# Patient Record
Sex: Female | Born: 1970 | Race: White | Hispanic: No | Marital: Married | Smoking: Never smoker
Health system: Southern US, Community
[De-identification: ages and names within clinical notes are randomized; demographics above are authoritative.]

## PROBLEM LIST (undated history)

## (undated) DIAGNOSIS — E119 Type 2 diabetes mellitus without complications: Secondary | ICD-10-CM

## (undated) DIAGNOSIS — T7840XA Allergy, unspecified, initial encounter: Secondary | ICD-10-CM

## (undated) DIAGNOSIS — R9389 Abnormal findings on diagnostic imaging of other specified body structures: Secondary | ICD-10-CM

## (undated) DIAGNOSIS — F32A Depression, unspecified: Secondary | ICD-10-CM

## (undated) DIAGNOSIS — G479 Sleep disorder, unspecified: Secondary | ICD-10-CM

## (undated) DIAGNOSIS — R091 Pleurisy: Secondary | ICD-10-CM

## (undated) DIAGNOSIS — E785 Hyperlipidemia, unspecified: Secondary | ICD-10-CM

## (undated) DIAGNOSIS — M940 Chondrocostal junction syndrome [Tietze]: Secondary | ICD-10-CM

## (undated) DIAGNOSIS — F329 Major depressive disorder, single episode, unspecified: Secondary | ICD-10-CM

## (undated) DIAGNOSIS — I10 Essential (primary) hypertension: Secondary | ICD-10-CM

## (undated) DIAGNOSIS — K219 Gastro-esophageal reflux disease without esophagitis: Secondary | ICD-10-CM

## (undated) DIAGNOSIS — F419 Anxiety disorder, unspecified: Secondary | ICD-10-CM

## (undated) HISTORY — DX: Major depressive disorder, single episode, unspecified: F32.9

## (undated) HISTORY — DX: Allergy, unspecified, initial encounter: T78.40XA

## (undated) HISTORY — DX: Chondrocostal junction syndrome (tietze): M94.0

## (undated) HISTORY — DX: Pleurisy: R09.1

## (undated) HISTORY — DX: Sleep disorder, unspecified: G47.9

## (undated) HISTORY — PX: TUBAL LIGATION: SHX77

## (undated) HISTORY — DX: Depression, unspecified: F32.A

## (undated) HISTORY — DX: Abnormal findings on diagnostic imaging of other specified body structures: R93.89

## (undated) HISTORY — DX: Gastro-esophageal reflux disease without esophagitis: K21.9

## (undated) HISTORY — DX: Essential (primary) hypertension: I10

## (undated) HISTORY — DX: Hyperlipidemia, unspecified: E78.5

## (undated) HISTORY — PX: DILATION AND CURETTAGE OF UTERUS: SHX78

## (undated) HISTORY — DX: Anxiety disorder, unspecified: F41.9

---

## 2002-12-29 ENCOUNTER — Other Ambulatory Visit: Admission: RE | Admit: 2002-12-29 | Discharge: 2002-12-29 | Payer: Self-pay | Admitting: Obstetrics & Gynecology

## 2003-08-13 ENCOUNTER — Inpatient Hospital Stay (HOSPITAL_COMMUNITY): Admission: AD | Admit: 2003-08-13 | Discharge: 2003-08-17 | Payer: Self-pay | Admitting: Obstetrics & Gynecology

## 2003-08-18 ENCOUNTER — Encounter: Admission: RE | Admit: 2003-08-18 | Discharge: 2003-09-17 | Payer: Self-pay | Admitting: Obstetrics & Gynecology

## 2003-09-18 ENCOUNTER — Encounter: Admission: RE | Admit: 2003-09-18 | Discharge: 2003-10-18 | Payer: Self-pay | Admitting: Obstetrics & Gynecology

## 2004-02-01 ENCOUNTER — Other Ambulatory Visit: Admission: RE | Admit: 2004-02-01 | Discharge: 2004-02-01 | Payer: Self-pay | Admitting: Obstetrics & Gynecology

## 2005-02-25 ENCOUNTER — Other Ambulatory Visit: Admission: RE | Admit: 2005-02-25 | Discharge: 2005-02-25 | Payer: Self-pay | Admitting: Obstetrics & Gynecology

## 2005-11-12 DIAGNOSIS — F32A Depression, unspecified: Secondary | ICD-10-CM | POA: Insufficient documentation

## 2005-11-12 DIAGNOSIS — E109 Type 1 diabetes mellitus without complications: Secondary | ICD-10-CM | POA: Insufficient documentation

## 2005-11-12 DIAGNOSIS — E782 Mixed hyperlipidemia: Secondary | ICD-10-CM | POA: Insufficient documentation

## 2005-11-12 DIAGNOSIS — F329 Major depressive disorder, single episode, unspecified: Secondary | ICD-10-CM | POA: Insufficient documentation

## 2005-11-12 DIAGNOSIS — E785 Hyperlipidemia, unspecified: Secondary | ICD-10-CM

## 2005-11-12 HISTORY — DX: Hyperlipidemia, unspecified: E78.5

## 2007-09-14 DIAGNOSIS — M1990 Unspecified osteoarthritis, unspecified site: Secondary | ICD-10-CM | POA: Insufficient documentation

## 2007-09-14 DIAGNOSIS — F419 Anxiety disorder, unspecified: Secondary | ICD-10-CM

## 2007-09-14 HISTORY — DX: Anxiety disorder, unspecified: F41.9

## 2007-10-12 ENCOUNTER — Ambulatory Visit: Payer: Self-pay | Admitting: Family Medicine

## 2007-10-12 DIAGNOSIS — R091 Pleurisy: Secondary | ICD-10-CM

## 2007-10-12 HISTORY — DX: Pleurisy: R09.1

## 2008-12-03 ENCOUNTER — Ambulatory Visit: Payer: Self-pay | Admitting: Family Medicine

## 2008-12-13 ENCOUNTER — Ambulatory Visit: Payer: Self-pay | Admitting: Family Medicine

## 2008-12-19 DIAGNOSIS — M940 Chondrocostal junction syndrome [Tietze]: Secondary | ICD-10-CM | POA: Insufficient documentation

## 2008-12-19 HISTORY — DX: Chondrocostal junction syndrome (tietze): M94.0

## 2008-12-28 HISTORY — PX: CHOLECYSTECTOMY: SHX55

## 2009-12-03 ENCOUNTER — Ambulatory Visit: Payer: Self-pay

## 2009-12-19 ENCOUNTER — Ambulatory Visit: Payer: Self-pay | Admitting: Surgery

## 2009-12-25 ENCOUNTER — Ambulatory Visit: Payer: Self-pay | Admitting: Surgery

## 2011-01-23 ENCOUNTER — Ambulatory Visit (HOSPITAL_COMMUNITY)
Admission: RE | Admit: 2011-01-23 | Discharge: 2011-01-23 | Payer: Self-pay | Source: Home / Self Care | Attending: Obstetrics & Gynecology | Admitting: Obstetrics & Gynecology

## 2011-01-23 LAB — CBC
MCV: 86 fL (ref 78.0–100.0)
Platelets: 285 10*3/uL (ref 150–400)
RDW: 12.3 % (ref 11.5–15.5)
WBC: 7.2 10*3/uL (ref 4.0–10.5)

## 2011-01-24 NOTE — Op Note (Signed)
  NAMEMYKENZIE, Andrea Hayden              ACCOUNT NO.:  [id number]  MEDICAL RECORD NO.:  [id number]          PATIENT TYPE:  AMB  LOCATION:  SDC                           FACILITY:  [facility]  PHYSICIAN:  Ilda Mori, Andrea.D.   DATE OF BIRTH:  06-18-1971  DATE OF PROCEDURE:  01/23/2011 DATE OF DISCHARGE:  01/23/2011                              OPERATIVE REPORT   PREOPERATIVE DIAGNOSIS:  Missed abortion.  POSTOPERATIVE DIAGNOSIS:  Missed abortion.  PROCEDURE:  Dilatation and evacuation.  SURGEON:  Ilda Mori, MD  ANESTHESIA:  Paracervical block with IV sedation.  BLOOD LOSS:  20 mL.  FINDINGS:  Products of conception consistent with an 8-week pregnancy. Pathology and chromosomal analysis were obtained from the specimen.  COMPLICATIONS:  Were none.  INDICATIONS:  This is a 40 year old, gravida 4, para 1-0-3-1 female, who was diagnosed with a missed ab earlier today.  An utlrasound done 3 weeks prior showed a living fetus at [redacted] weeks gestation.  Today a repeat ultrasound should a fetal pole only 8  week size with no cardiac activity.  The findings were discussed with the patient and she elected to proceed with evacuation of the failed pregnancy.  PROCEDURE:  The patient was taken to the operating room and placed in the dorsal lithotomy supine position and IV sedation was administered. The vagina was prepped and draped in sterile fashion.  The cervix was infiltrated with 10 mL of 2% lidocaine solution.  The internal os was easily dilated with Shawnie Pons dilators to 25-French and #8 suction curette was introduced and the products of conception were evacuated.  A portion of the specimen was sent for chromosomal analysis.  The procedure was then terminated and the patient left the operating room in good condition.     Ilda Mori, Andrea.D.     RK/MEDQ  D:  01/23/2011  T:  2011/01/26  Job:  [id number]  Electronically Signed by Ilda Mori Andrea.D. on January 26, 2011 12:41:55 PM

## 2011-01-28 DEATH — deceased

## 2012-01-04 ENCOUNTER — Ambulatory Visit: Payer: Self-pay | Admitting: Family Medicine

## 2012-06-15 ENCOUNTER — Ambulatory Visit: Payer: Self-pay | Admitting: Family Medicine

## 2012-06-15 LAB — CBC WITH DIFFERENTIAL/PLATELET
Basophil #: 0 10*3/uL (ref 0.0–0.1)
Eosinophil %: 0.5 %
HGB: 12.6 g/dL (ref 12.0–16.0)
Lymphocyte %: 11.4 %
MCHC: 33.8 g/dL (ref 32.0–36.0)
MCV: 87 fL (ref 80–100)
Monocyte %: 7.8 %
Neutrophil #: 7.1 10*3/uL — ABNORMAL HIGH (ref 1.4–6.5)
Platelet: 247 10*3/uL (ref 150–440)
RBC: 4.31 10*6/uL (ref 3.80–5.20)

## 2012-06-15 LAB — COMPREHENSIVE METABOLIC PANEL
Albumin: 3.7 g/dL (ref 3.4–5.0)
Alkaline Phosphatase: 116 U/L (ref 50–136)
BUN: 10 mg/dL (ref 7–18)
Bilirubin,Total: 0.4 mg/dL (ref 0.2–1.0)
Calcium, Total: 8.8 mg/dL (ref 8.5–10.1)
Chloride: 103 mmol/L (ref 98–107)
Co2: 29 mmol/L (ref 21–32)
Creatinine: 0.74 mg/dL (ref 0.60–1.30)
EGFR (African American): >60
Glucose: 98 mg/dL (ref 65–99)
Sodium: 139 mmol/L (ref 136–145)

## 2012-06-22 ENCOUNTER — Ambulatory Visit: Payer: Self-pay | Admitting: Family Medicine

## 2013-04-24 ENCOUNTER — Other Ambulatory Visit: Payer: Self-pay | Admitting: Obstetrics & Gynecology

## 2013-04-24 ENCOUNTER — Other Ambulatory Visit: Payer: Self-pay

## 2013-04-25 ENCOUNTER — Other Ambulatory Visit: Payer: Self-pay | Admitting: Obstetrics & Gynecology

## 2013-07-05 ENCOUNTER — Other Ambulatory Visit: Payer: Self-pay | Admitting: Obstetrics and Gynecology

## 2013-07-17 ENCOUNTER — Other Ambulatory Visit: Payer: Self-pay

## 2013-07-19 ENCOUNTER — Other Ambulatory Visit: Payer: Self-pay

## 2013-07-25 ENCOUNTER — Other Ambulatory Visit (HOSPITAL_COMMUNITY): Payer: Self-pay | Admitting: Obstetrics and Gynecology

## 2013-07-25 DIAGNOSIS — O283 Abnormal ultrasonic finding on antenatal screening of mother: Secondary | ICD-10-CM

## 2013-07-25 DIAGNOSIS — O09529 Supervision of elderly multigravida, unspecified trimester: Secondary | ICD-10-CM

## 2013-07-28 ENCOUNTER — Encounter (HOSPITAL_COMMUNITY): Payer: Self-pay

## 2013-07-28 ENCOUNTER — Ambulatory Visit (HOSPITAL_COMMUNITY)
Admission: RE | Admit: 2013-07-28 | Discharge: 2013-07-28 | Disposition: A | Discharge status: Home / Self Care | Payer: BC Managed Care – PPO | Source: Ambulatory Visit | Attending: Obstetrics and Gynecology | Admitting: Obstetrics and Gynecology

## 2013-07-28 DIAGNOSIS — O24919 Unspecified diabetes mellitus in pregnancy, unspecified trimester: Secondary | ICD-10-CM | POA: Insufficient documentation

## 2013-07-28 DIAGNOSIS — Z363 Encounter for antenatal screening for malformations: Secondary | ICD-10-CM | POA: Insufficient documentation

## 2013-07-28 DIAGNOSIS — Z1389 Encounter for screening for other disorder: Secondary | ICD-10-CM | POA: Insufficient documentation

## 2013-07-28 DIAGNOSIS — O358XX0 Maternal care for other (suspected) fetal abnormality and damage, not applicable or unspecified: Secondary | ICD-10-CM | POA: Insufficient documentation

## 2013-07-28 DIAGNOSIS — O09529 Supervision of elderly multigravida, unspecified trimester: Secondary | ICD-10-CM | POA: Insufficient documentation

## 2013-07-28 DIAGNOSIS — O283 Abnormal ultrasonic finding on antenatal screening of mother: Secondary | ICD-10-CM

## 2013-07-28 NOTE — Progress Notes (Signed)
Norberta MAKENZIE WEISNER  was seen today for an ultrasound appointment.  See full report in [facility].  Comments: Ms. Florence is a 42 yo G5P1031 currently at 45 6/7 weeks with a history of type 1 diabetes on an insulin pump.  She recently had a normal fetal echo.  The patient was seen today due to concerns of  echogenic lungs.  Impression: Single IUP at 28 6/7 weeks Normal anatomic survey. The fetal lungs appear to be of normal texture - no discrete masses are noted.  There is no evidence of mediastinal shift. The estimated fetal weight today is at the 87th %tile. Normal amniotic fluid volume.  Recommendations: Recommend serial growth scans every 4 weeks for interval growth- please contact our office if you would prefer that these studies be performed here. Antepartum fetal testing (NSTs with weekly AFIs) beginning at 32 weeks.  Alpha Gula, MD

## 2013-08-05 DIAGNOSIS — E7800 Pure hypercholesterolemia, unspecified: Secondary | ICD-10-CM | POA: Insufficient documentation

## 2013-08-29 ENCOUNTER — Encounter (HOSPITAL_COMMUNITY): Payer: Self-pay | Admitting: *Deleted

## 2013-08-29 ENCOUNTER — Inpatient Hospital Stay (HOSPITAL_COMMUNITY)
Admission: AD | Admit: 2013-08-29 | Discharge: 2013-08-29 | Disposition: A | Discharge status: Home / Self Care | Payer: BC Managed Care – PPO | Source: Ambulatory Visit | Attending: Obstetrics and Gynecology | Admitting: Obstetrics and Gynecology

## 2013-08-29 DIAGNOSIS — O139 Gestational [pregnancy-induced] hypertension without significant proteinuria, unspecified trimester: Secondary | ICD-10-CM | POA: Insufficient documentation

## 2013-08-29 DIAGNOSIS — O4700 False labor before 37 completed weeks of gestation, unspecified trimester: Secondary | ICD-10-CM | POA: Insufficient documentation

## 2013-08-29 DIAGNOSIS — O133 Gestational [pregnancy-induced] hypertension without significant proteinuria, third trimester: Secondary | ICD-10-CM

## 2013-08-29 HISTORY — DX: Type 2 diabetes mellitus without complications: E11.9

## 2013-08-29 LAB — URINALYSIS, ROUTINE W REFLEX MICROSCOPIC
Bilirubin Urine: NEGATIVE
Nitrite: NEGATIVE
Specific Gravity, Urine: 1.01 (ref 1.005–1.030)
Urobilinogen, UA: 0.2 mg/dL (ref 0.0–1.0)

## 2013-08-29 LAB — COMPREHENSIVE METABOLIC PANEL
Albumin: 2.5 g/dL — ABNORMAL LOW (ref 3.5–5.2)
BUN: 8 mg/dL (ref 6–23)
Creatinine, Ser: 0.64 mg/dL (ref 0.50–1.10)
Total Protein: 6.5 g/dL (ref 6.0–8.3)

## 2013-08-29 LAB — URINE MICROSCOPIC-ADD ON

## 2013-08-29 LAB — CBC
HCT: 36 % (ref 36.0–46.0)
MCHC: 34.4 g/dL (ref 30.0–36.0)
MCV: 86.7 fL (ref 78.0–100.0)
RDW: 14 % (ref 11.5–15.5)

## 2013-08-29 MED ORDER — NIFEDIPINE 10 MG PO CAPS
10.0000 mg | ORAL_CAPSULE | ORAL | Status: AC
  Administered 2013-08-29 (×2): 10 mg via ORAL
  Filled 2013-08-29 (×2): qty 1

## 2013-08-29 MED ORDER — NIFEDIPINE 10 MG PO CAPS
10.0000 mg | ORAL_CAPSULE | Freq: Once | ORAL | Status: AC
  Administered 2013-08-29: 10 mg via ORAL
  Filled 2013-08-29: qty 1

## 2013-08-29 NOTE — MAU Provider Note (Signed)
History     CSN: [id number]  Arrival date and time: 08/29/13 1656   None     Chief Complaint  Patient presents with  . high blood pressure3    HPI  Pt is a G5P1031 at [redacted]w[redacted]d sent from office with report of elevated blood pressure.  Pt denies headache, epigastric pain or vision changes.  Report intermittent contractions. Cervix reported as closed at office.  +fetal movement.  No vaginal bleeding.    Past Medical History  Diagnosis Date  . Diabetes mellitus without complication     Type 1: external insulin pump    Past Surgical History  Procedure Laterality Date  . Cholecystectomy  2010  . Cesarean section    . Dilation and curettage of uterus      Family History  Problem Relation Age of Onset  . Hypertension Mother   . Hypertension Father   . Hypertension Paternal Grandmother   . Hypertension Paternal Grandfather     History  Substance Use Topics  . Smoking status: Never Smoker   . Smokeless tobacco: Not on file  . Alcohol Use: No    Allergies: No Known Allergies  Prescriptions prior to admission  Medication Sig Dispense Refill  . acetaminophen (TYLENOL) 325 MG tablet Take 650 mg by mouth daily as needed for pain.      . Insulin Human (INSULIN PUMP) 100 unit/ml SOLN Inject 1 each into the skin 3 times daily with meals, bedtime and 2 AM. Novolog insulin      . Prenatal Vit-Fe Fumarate-FA (PRENATAL MULTIVITAMIN) TABS tablet Take 1 tablet by mouth daily at 12 noon.        Review of Systems  Eyes: Negative for blurred vision, double vision and photophobia.  Gastrointestinal: Positive for abdominal pain (cramping).  Neurological: Negative for headaches.  All other systems reviewed and are negative.   Physical Exam   Blood pressure 159/83, pulse 88, temperature 98.3 F (36.8 C), temperature source Oral, resp. rate 18, height 5\' 4"  (1.626 m), weight 99.338 kg (219 lb), last menstrual period 01/07/2013, SpO2 100.00%. Filed Vitals:   08/29/13 1820 08/29/13 1825  08/29/13 1830 08/29/13 1831  BP:    149/77  Pulse: 83 85 74 75  Temp:      TempSrc:      Resp:      Height:      Weight:      SpO2: 99% 98% 99%     Physical Exam  Constitutional: She is oriented to person, place, and time. She appears well-developed and well-nourished. No distress.  HENT:  Head: Normocephalic.  Neck: Normal range of motion. Neck supple.  Cardiovascular: Normal rate, regular rhythm and normal heart sounds.   Respiratory: Effort normal and breath sounds normal.  GI: Soft. There is no tenderness.  Genitourinary: No bleeding around the vagina. Vaginal discharge (mucusy) found.  Musculoskeletal: Normal range of motion. She exhibits edema (3+ pedal ).  Neurological: She is alert and oriented to person, place, and time. She displays normal reflexes.  Skin: Skin is warm and dry.   FHR 140's, +accels, reactive Toco - 3-8  Dilation: Closed Effacement (%): 0 Cervical Position: Posterior Exam by:: Roney Marion, CNM  MAU Course  Procedures  Results for orders placed during the hospital encounter of 08/29/13 (from the past 24 hour(s))  URINALYSIS, ROUTINE W REFLEX MICROSCOPIC     Status: Abnormal   Collection Time    08/29/13  5:15 PM      Result Value Range  Color, Urine YELLOW  YELLOW   APPearance CLEAR  CLEAR   Specific Gravity, Urine 1.010  1.005 - 1.030   pH 6.0  5.0 - 8.0   Glucose, UA NEGATIVE  NEGATIVE mg/dL   Hgb urine dipstick TRACE (*) NEGATIVE   Bilirubin Urine NEGATIVE  NEGATIVE   Ketones, ur NEGATIVE  NEGATIVE mg/dL   Protein, ur NEGATIVE  NEGATIVE mg/dL   Urobilinogen, UA 0.2  0.0 - 1.0 mg/dL   Nitrite NEGATIVE  NEGATIVE   Leukocytes, UA NEGATIVE  NEGATIVE  URINE MICROSCOPIC-ADD ON     Status: Abnormal   Collection Time    08/29/13  5:15 PM      Result Value Range   Squamous Epithelial / LPF FEW (*) RARE   RBC / HPF 0-2  <3 RBC/hpf  CBC     Status: None   Collection Time    08/29/13  6:10 PM      Result Value Range   WBC 10.3  4.0 - 10.5  K/uL   RBC 4.15  3.87 - 5.11 MIL/uL   Hemoglobin 12.4  12.0 - 15.0 g/dL   HCT 40.9  81.1 - 91.4 %   MCV 86.7  78.0 - 100.0 fL   MCH 29.9  26.0 - 34.0 pg   MCHC 34.4  30.0 - 36.0 g/dL   RDW 78.2  95.6 - 21.3 %   Platelets 220  150 - 400 K/uL  COMPREHENSIVE METABOLIC PANEL     Status: Abnormal   Collection Time    08/29/13  6:10 PM      Result Value Range   Sodium 135  135 - 145 mEq/L   Potassium 4.0  3.5 - 5.1 mEq/L   Chloride 102  96 - 112 mEq/L   CO2 22  19 - 32 mEq/L   Glucose, Bld 65 (*) 70 - 99 mg/dL   BUN 8  6 - 23 mg/dL   Creatinine, Ser 0.86  0.50 - 1.10 mg/dL   Calcium 9.5  8.4 - 57.8 mg/dL   Total Protein 6.5  6.0 - 8.3 g/dL   Albumin 2.5 (*) 3.5 - 5.2 g/dL   AST 15  0 - 37 U/L   ALT 13  0 - 35 U/L   Alkaline Phosphatase 134 (*) 39 - 117 U/L   Total Bilirubin 0.2 (*) 0.3 - 1.2 mg/dL   GFR calc non Af Amer >90  >90 mL/min   GFR calc Af Amer >90  >90 mL/min   0702 Dr. Dareen Piano called with results > no answer 0710 Dr. Dareen Piano called > left message 0730 Informed by staff Dr. Henderson Cloud is covering > give PO Procardia and may discharge to home with follow-up on Friday 0740 Pt informed regarding order for procardia and plan to reevaluate.   [id number]  Report given to H. Mathews Robinsons who assumes care of patient. 2130: Contractions have stopped with procardia. Will DC home.  [facility] 08/29/2013, 8:00 PM   Assessment and Plan   1. Gestational hypertension w/o significant proteinuria in 3rd trimester    Danger signs/pre-eclampsia precautions given Fetal kick counts FU with the office on Friday.   Tawnya Crook

## 2013-08-29 NOTE — MAU Note (Signed)
Patient presents today having been sent over from the office for elevated blood pressure. Was checked in office and was closed. Denies LOF, contractions, nor bleeding. Denies headache nor blurry vision.

## 2013-09-11 ENCOUNTER — Encounter (HOSPITAL_COMMUNITY): Payer: Self-pay | Admitting: *Deleted

## 2013-09-11 ENCOUNTER — Encounter (HOSPITAL_COMMUNITY)
Admission: AD | Disposition: A | Discharge status: Home / Self Care | Payer: Self-pay | Source: Ambulatory Visit | Attending: Obstetrics and Gynecology

## 2013-09-11 ENCOUNTER — Inpatient Hospital Stay (HOSPITAL_COMMUNITY)
Admission: AD | Admit: 2013-09-11 | Discharge: 2013-09-15 | DRG: 650 | Disposition: A | Discharge status: Home / Self Care | Payer: BC Managed Care – PPO | Source: Ambulatory Visit | Attending: Obstetrics and Gynecology | Admitting: Obstetrics and Gynecology

## 2013-09-11 ENCOUNTER — Inpatient Hospital Stay (HOSPITAL_COMMUNITY): Payer: BC Managed Care – PPO | Admitting: Anesthesiology

## 2013-09-11 ENCOUNTER — Encounter (HOSPITAL_COMMUNITY): Payer: Self-pay | Admitting: Anesthesiology

## 2013-09-11 DIAGNOSIS — Z794 Long term (current) use of insulin: Secondary | ICD-10-CM

## 2013-09-11 DIAGNOSIS — O09529 Supervision of elderly multigravida, unspecified trimester: Secondary | ICD-10-CM | POA: Diagnosis present

## 2013-09-11 DIAGNOSIS — E119 Type 2 diabetes mellitus without complications: Secondary | ICD-10-CM

## 2013-09-11 DIAGNOSIS — O1414 Severe pre-eclampsia complicating childbirth: Principal | ICD-10-CM | POA: Diagnosis present

## 2013-09-11 DIAGNOSIS — O2432 Unspecified pre-existing diabetes mellitus in childbirth: Secondary | ICD-10-CM | POA: Diagnosis present

## 2013-09-11 DIAGNOSIS — Z302 Encounter for sterilization: Secondary | ICD-10-CM

## 2013-09-11 DIAGNOSIS — E109 Type 1 diabetes mellitus without complications: Secondary | ICD-10-CM | POA: Diagnosis present

## 2013-09-11 LAB — COMPREHENSIVE METABOLIC PANEL
AST: 15 U/L (ref 0–37)
BUN: 8 mg/dL (ref 6–23)
CO2: 20 mEq/L (ref 19–32)
CO2: 20 mEq/L (ref 19–32)
Calcium: 8.4 mg/dL (ref 8.4–10.5)
Chloride: 101 mEq/L (ref 96–112)
Creatinine, Ser: 0.6 mg/dL (ref 0.50–1.10)
Creatinine, Ser: 0.58 mg/dL (ref 0.50–1.10)
GFR calc Af Amer: >90 mL/min (ref >90)
GFR calc non Af Amer: >90 mL/min (ref >90)
GFR calc non Af Amer: >90 mL/min (ref >90)
Glucose, Bld: 106 mg/dL — ABNORMAL HIGH (ref 70–99)
Total Bilirubin: 0.2 mg/dL — ABNORMAL LOW (ref 0.3–1.2)
Total Protein: 5.9 g/dL — ABNORMAL LOW (ref 6.0–8.3)

## 2013-09-11 LAB — CBC
HCT: 36.1 % (ref 36.0–46.0)
Hemoglobin: 12.7 g/dL (ref 12.0–15.0)
MCH: 30.3 pg (ref 26.0–34.0)
MCHC: 35.2 g/dL (ref 30.0–36.0)
MCV: 86.4 fL (ref 78.0–100.0)
MCV: 86.2 fL (ref 78.0–100.0)
Platelets: 197 10*3/uL (ref 150–400)
RBC: 4.18 MIL/uL (ref 3.87–5.11)
RBC: 4.19 MIL/uL (ref 3.87–5.11)
WBC: 9.6 10*3/uL (ref 4.0–10.5)

## 2013-09-11 LAB — GLUCOSE, CAPILLARY
Glucose-Capillary: 68 mg/dL — ABNORMAL LOW (ref 70–99)
Glucose-Capillary: 106 mg/dL — ABNORMAL HIGH (ref 70–99)
Glucose-Capillary: 136 mg/dL — ABNORMAL HIGH (ref 70–99)
Glucose-Capillary: 236 mg/dL — ABNORMAL HIGH (ref 70–99)

## 2013-09-11 LAB — LACTATE DEHYDROGENASE: LDH: 185 U/L (ref 94–250)

## 2013-09-11 LAB — URINALYSIS, ROUTINE W REFLEX MICROSCOPIC
Bilirubin Urine: NEGATIVE
Ketones, ur: NEGATIVE mg/dL
Nitrite: NEGATIVE
pH: 6 (ref 5.0–8.0)

## 2013-09-11 LAB — URINE MICROSCOPIC-ADD ON

## 2013-09-11 LAB — URIC ACID: Uric Acid, Serum: 4.8 mg/dL (ref 2.4–7.0)

## 2013-09-11 SURGERY — Surgical Case
Anesthesia: Spinal | Site: Abdomen | Wound class: Clean Contaminated

## 2013-09-11 MED ORDER — MAGNESIUM SULFATE 40 G IN LACTATED RINGERS - SIMPLE
2.0000 g/h | INTRAVENOUS | Status: DC
  Administered 2013-09-12: 2 g/h via INTRAVENOUS
  Filled 2013-09-11 (×2): qty 500

## 2013-09-11 MED ORDER — INSULIN REGULAR BOLUS VIA INFUSION
0.0000 [IU] | Freq: Three times a day (TID) | INTRAVENOUS | Status: DC
  Administered 2013-09-12 (×2): 0 [IU] via INTRAVENOUS
  Filled 2013-09-11: qty 10

## 2013-09-11 MED ORDER — KETOROLAC TROMETHAMINE 60 MG/2ML IM SOLN
60.0000 mg | Freq: Once | INTRAMUSCULAR | Status: AC | PRN
  Administered 2013-09-11: 60 mg via INTRAMUSCULAR

## 2013-09-11 MED ORDER — CITRIC ACID-SODIUM CITRATE 334-500 MG/5ML PO SOLN
ORAL | Status: AC
  Administered 2013-09-11: 30 mL
  Filled 2013-09-11: qty 15

## 2013-09-11 MED ORDER — LABETALOL HCL 5 MG/ML IV SOLN
10.0000 mg | INTRAVENOUS | Status: DC | PRN
  Administered 2013-09-11 – 2013-09-13 (×3): 10 mg via INTRAVENOUS
  Filled 2013-09-11 (×4): qty 4

## 2013-09-11 MED ORDER — BUPIVACAINE IN DEXTROSE 0.75-8.25 % IT SOLN
INTRATHECAL | Status: DC | PRN
  Administered 2013-09-11: 1.4 mL via INTRATHECAL

## 2013-09-11 MED ORDER — SODIUM CHLORIDE 0.9 % IV SOLN
INTRAVENOUS | Status: DC
  Administered 2013-09-11: 2 [IU]/h via INTRAVENOUS
  Administered 2013-09-11: 6.1 [IU]/h via INTRAVENOUS
  Administered 2013-09-11: 3.5 [IU]/h via INTRAVENOUS
  Filled 2013-09-11: qty 1

## 2013-09-11 MED ORDER — OXYTOCIN 10 UNIT/ML IJ SOLN
40.0000 [IU] | INTRAVENOUS | Status: DC | PRN
  Administered 2013-09-11: 40 [IU] via INTRAVENOUS

## 2013-09-11 MED ORDER — KETOROLAC TROMETHAMINE 60 MG/2ML IM SOLN
INTRAMUSCULAR | Status: AC
  Filled 2013-09-11: qty 2

## 2013-09-11 MED ORDER — SCOPOLAMINE 1 MG/3DAYS TD PT72
1.0000 | MEDICATED_PATCH | Freq: Once | TRANSDERMAL | Status: AC
  Administered 2013-09-11: 1.5 mg via TRANSDERMAL

## 2013-09-11 MED ORDER — FENTANYL CITRATE 0.05 MG/ML IJ SOLN
25.0000 ug | INTRAMUSCULAR | Status: DC | PRN
  Administered 2013-09-12 (×2): 50 ug via INTRAVENOUS

## 2013-09-11 MED ORDER — DEXTROSE 5 % IN LACTATED RINGERS IV BOLUS: 125.0000 mL | Freq: Once | INTRAVENOUS | Status: DC

## 2013-09-11 MED ORDER — NIFEDIPINE 10 MG PO CAPS
10.0000 mg | ORAL_CAPSULE | Freq: Once | ORAL | Status: AC
  Administered 2013-09-11: 10 mg via ORAL
  Filled 2013-09-11: qty 1

## 2013-09-11 MED ORDER — PRENATAL MULTIVITAMIN CH: 1.0000 | ORAL_TABLET | Freq: Every day | ORAL | Status: DC

## 2013-09-11 MED ORDER — DEXTROSE IN LACTATED RINGERS 5 % IV SOLN
INTRAVENOUS | Status: DC
  Administered 2013-09-11: 1000 mL via INTRAVENOUS
  Administered 2013-09-11: 15:00:00 via INTRAVENOUS

## 2013-09-11 MED ORDER — MORPHINE SULFATE 0.5 MG/ML IJ SOLN
INTRAMUSCULAR | Status: AC
  Filled 2013-09-11: qty 10

## 2013-09-11 MED ORDER — MEPERIDINE HCL 25 MG/ML IJ SOLN
INTRAMUSCULAR | Status: AC
  Filled 2013-09-11: qty 1

## 2013-09-11 MED ORDER — DOCUSATE SODIUM 100 MG PO CAPS: 100.0000 mg | ORAL_CAPSULE | Freq: Every day | ORAL | Status: DC

## 2013-09-11 MED ORDER — OXYTOCIN 10 UNIT/ML IJ SOLN
INTRAMUSCULAR | Status: AC
  Filled 2013-09-11: qty 4

## 2013-09-11 MED ORDER — ACETAMINOPHEN 325 MG PO TABS: 650.0000 mg | ORAL_TABLET | ORAL | Status: DC | PRN

## 2013-09-11 MED ORDER — CALCIUM CARBONATE ANTACID 500 MG PO CHEW: 2.0000 | CHEWABLE_TABLET | ORAL | Status: DC | PRN

## 2013-09-11 MED ORDER — DEXTROSE-NACL 5-0.45 % IV SOLN
INTRAVENOUS | Status: DC
  Administered 2013-09-12: 02:00:00 via INTRAVENOUS

## 2013-09-11 MED ORDER — MORPHINE SULFATE (PF) 0.5 MG/ML IJ SOLN
INTRAMUSCULAR | Status: DC | PRN
  Administered 2013-09-11: .15 mg via INTRATHECAL

## 2013-09-11 MED ORDER — PHENYLEPHRINE 40 MCG/ML (10ML) SYRINGE FOR IV PUSH (FOR BLOOD PRESSURE SUPPORT)
PREFILLED_SYRINGE | INTRAVENOUS | Status: AC
  Filled 2013-09-11: qty 15

## 2013-09-11 MED ORDER — MAGNESIUM SULFATE BOLUS VIA INFUSION
6.0000 g | Freq: Once | INTRAVENOUS | Status: AC
  Administered 2013-09-11: 6 g via INTRAVENOUS
  Filled 2013-09-11: qty 500

## 2013-09-11 MED ORDER — ONDANSETRON HCL 4 MG/2ML IJ SOLN
INTRAMUSCULAR | Status: AC
  Filled 2013-09-11: qty 2

## 2013-09-11 MED ORDER — CEFAZOLIN SODIUM-DEXTROSE 2-3 GM-% IV SOLR
2.0000 g | INTRAVENOUS | Status: AC
  Administered 2013-09-11: 2 g via INTRAVENOUS
  Filled 2013-09-11: qty 50

## 2013-09-11 MED ORDER — SODIUM CHLORIDE 0.45 % IV SOLN: INTRAVENOUS | Status: DC

## 2013-09-11 MED ORDER — PHENYLEPHRINE 40 MCG/ML (10ML) SYRINGE FOR IV PUSH (FOR BLOOD PRESSURE SUPPORT)
PREFILLED_SYRINGE | INTRAVENOUS | Status: AC
  Filled 2013-09-11: qty 10

## 2013-09-11 MED ORDER — DEXTROSE 50 % IV SOLN: 25.0000 mL | INTRAVENOUS | Status: DC | PRN

## 2013-09-11 MED ORDER — INSULIN ASPART 100 UNIT/ML ~~LOC~~ SOLN: 0.0000 [IU] | Freq: Once | SUBCUTANEOUS | Status: DC

## 2013-09-11 MED ORDER — LACTATED RINGERS IV SOLN
INTRAVENOUS | Status: DC | PRN
  Administered 2013-09-11: 19:00:00 via INTRAVENOUS

## 2013-09-11 MED ORDER — MEPERIDINE HCL 25 MG/ML IJ SOLN: 6.2500 mg | INTRAMUSCULAR | Status: DC | PRN

## 2013-09-11 MED ORDER — ONDANSETRON HCL 4 MG/2ML IJ SOLN
INTRAMUSCULAR | Status: DC | PRN
  Administered 2013-09-11: 4 mg via INTRAVENOUS

## 2013-09-11 MED ORDER — SODIUM CHLORIDE 0.9 % IV SOLN: INTRAVENOUS | Status: DC

## 2013-09-11 MED ORDER — LACTATED RINGERS IV SOLN
INTRAVENOUS | Status: DC
  Administered 2013-09-11: 13:00:00 via INTRAVENOUS

## 2013-09-11 MED ORDER — FENTANYL CITRATE 0.05 MG/ML IJ SOLN
INTRAMUSCULAR | Status: AC
  Filled 2013-09-11: qty 2

## 2013-09-11 MED ORDER — 0.9 % SODIUM CHLORIDE (POUR BTL) OPTIME
TOPICAL | Status: DC | PRN
  Administered 2013-09-11: 500 mL

## 2013-09-11 MED ORDER — ZOLPIDEM TARTRATE 5 MG PO TABS: 5.0000 mg | ORAL_TABLET | Freq: Every evening | ORAL | Status: DC | PRN

## 2013-09-11 MED ORDER — PHENYLEPHRINE 40 MCG/ML (10ML) SYRINGE FOR IV PUSH (FOR BLOOD PRESSURE SUPPORT)
PREFILLED_SYRINGE | INTRAVENOUS | Status: AC
  Filled 2013-09-11: qty 5

## 2013-09-11 MED ORDER — FENTANYL CITRATE 0.05 MG/ML IJ SOLN
INTRAMUSCULAR | Status: DC | PRN
  Administered 2013-09-11: 25 ug via INTRATHECAL

## 2013-09-11 MED ORDER — SCOPOLAMINE 1 MG/3DAYS TD PT72
MEDICATED_PATCH | TRANSDERMAL | Status: AC
  Filled 2013-09-11: qty 1

## 2013-09-11 MED ORDER — PHENYLEPHRINE HCL 10 MG/ML IJ SOLN
INTRAMUSCULAR | Status: DC | PRN
  Administered 2013-09-11 (×4): 40 ug via INTRAVENOUS
  Administered 2013-09-11 (×4): 80 ug via INTRAVENOUS
  Administered 2013-09-11 (×3): 40 ug via INTRAVENOUS
  Administered 2013-09-11 (×2): 80 ug via INTRAVENOUS
  Administered 2013-09-11: 40 ug via INTRAVENOUS

## 2013-09-11 SURGICAL SUPPLY — 37 items
ADH SKN CLS APL DERMABOND .7 (GAUZE/BANDAGES/DRESSINGS)
ADH SKN CLS LQ APL DERMABOND (GAUZE/BANDAGES/DRESSINGS) ×2
CLAMP CORD UMBIL (MISCELLANEOUS) IMPLANT
CLEANER TIP ELECTROSURG 2X2 (MISCELLANEOUS) ×3 IMPLANT
CLIP FILSHIE TUBAL LIGA STRL (Clip) ×2 IMPLANT
CLOTH BEACON ORANGE TIMEOUT ST (SAFETY) ×3 IMPLANT
DERMABOND ADHESIVE PROPEN (GAUZE/BANDAGES/DRESSINGS) ×1
DERMABOND ADVANCED (GAUZE/BANDAGES/DRESSINGS)
DERMABOND ADVANCED .7 DNX12 (GAUZE/BANDAGES/DRESSINGS) IMPLANT
DERMABOND ADVANCED .7 DNX6 (GAUZE/BANDAGES/DRESSINGS) ×1 IMPLANT
DEVICE BLD TRNS LUER ATTCH (MISCELLANEOUS) ×3 IMPLANT
DRAPE LG THREE QUARTER DISP (DRAPES) ×6 IMPLANT
DRSG OPSITE POSTOP 4X10 (GAUZE/BANDAGES/DRESSINGS) ×3 IMPLANT
DURAPREP 26ML APPLICATOR (WOUND CARE) ×3 IMPLANT
ELECT REM PT RETURN 9FT ADLT (ELECTROSURGICAL) ×3
ELECTRODE REM PT RTRN 9FT ADLT (ELECTROSURGICAL) ×2 IMPLANT
EXTRACTOR VACUUM M CUP 4 TUBE (SUCTIONS) ×2 IMPLANT
GLOVE BIO SURGEON STRL SZ7 (GLOVE) ×3 IMPLANT
GOWN STRL REIN XL XLG (GOWN DISPOSABLE) ×6 IMPLANT
KIT ABG SYR 3ML LUER SLIP (SYRINGE) IMPLANT
NDL HYPO 25X5/8 SAFETYGLIDE (NEEDLE) IMPLANT
NEEDLE HYPO 25X5/8 SAFETYGLIDE (NEEDLE) IMPLANT
NS IRRIG 1000ML POUR BTL (IV SOLUTION) ×3 IMPLANT
PACK C SECTION WH (CUSTOM PROCEDURE TRAY) ×3 IMPLANT
PAD OB MATERNITY 4.3X12.25 (PERSONAL CARE ITEMS) ×3 IMPLANT
PENCIL BUTTON HOLSTER BLD 10FT (ELECTRODE) ×3 IMPLANT
RTRCTR C-SECT PINK 25CM LRG (MISCELLANEOUS) ×3 IMPLANT
STAPLER VISISTAT 35W (STAPLE) IMPLANT
SUT CHROMIC 1 CTX 36 (SUTURE) ×8 IMPLANT
SUT PDS AB 0 CTX 60 (SUTURE) ×3 IMPLANT
SUT PLAIN 2 0 XLH (SUTURE) ×2 IMPLANT
SUT VIC AB 2-0 CT1 27 (SUTURE) ×3
SUT VIC AB 2-0 CT1 TAPERPNT 27 (SUTURE) ×2 IMPLANT
SUT VIC AB 4-0 KS 27 (SUTURE) ×2 IMPLANT
TOWEL OR 17X24 6PK STRL BLUE (TOWEL DISPOSABLE) ×3 IMPLANT
TRAY FOLEY CATH 14FR (SET/KITS/TRAYS/PACK) ×3 IMPLANT
WATER STERILE IRR 1000ML POUR (IV SOLUTION) ×3 IMPLANT

## 2013-09-11 NOTE — Anesthesia Procedure Notes (Signed)
Spinal  Patient location during procedure: OR Start time: 09/11/2013 7:20 PM Staffing Anesthesiologist: Areya Lemmerman A. Performed by: anesthesiologist  Preanesthetic Checklist Completed: patient identified, site marked, surgical consent, pre-op evaluation, timeout performed, IV checked, risks and benefits discussed and monitors and equipment checked Spinal Block Patient position: sitting Prep: site prepped and draped and DuraPrep Patient monitoring: heart rate, cardiac monitor, continuous pulse ox and blood pressure Approach: midline Location: L3-4 Injection technique: single-shot Needle Needle type: Sprotte  Needle gauge: 24 G Needle length: 9 cm Assessment Sensory level: T4 Events: paresthesia Additional Notes Transient paresthesia right leg. Patient tolerated procedure well. Adequate sensory level.

## 2013-09-11 NOTE — Op Note (Signed)
Pre-Operative Diagnosis: 1) Severe preeclampsia 2) History of prior cesarean section declines trial of labor 3) insulin-dependent diabetic 4) Desired permanent sterilization Postoperative Diagnosis: Same Procedure: Repeat low transverse cesarean section with bilateral tubal ligation Surgeon: Dr. Waynard Reeds Assistant:None Operative Findings:Vigorous female infant in the vertex presentation with Apgar scores of 8 at 1 minute and 9 at 5 minutes. Thin lower uterine segment. Normal-appearing ovaries and tubes. Specimen:Placenta to pathology EBL: Total I/O In: 600 [I.V.:600] Out: 775 [Urine:75; Blood:700]   Procedure:Ms. Weinfeld is an 42 year old gravida 5 para 1031 at 67 weeks and 2 days estimated gestational age who presents for cesarean section. Over the last month of her pregnancy the patient has been followed for elevated blood pressure. She noted that her blood pressures were elevated today and presented to maternity admissions for evaluation. Her blood pressures were in the severe range and after discussion with maternal-fetal medicine and the decision was made to proceed with delivery. Following the appropriate informed consent the patient was brought to the operating room where spinal anesthesia was administered and found to be adequate. She was placed in the dorsal supine position with a leftward tilt. She was prepped and draped in the normal sterile fashion. Scalpel was then used to make a Pfannenstiel skin incision which was carried down to the underlying layers of soft tissue to the fascia. The fascia was incised in the midline and the fascial incision was extended laterally with Mayo scissors. The superior aspect of the fascial incision was grasped with Coker clamps x2, tented up and the rectus muscles dissected off sharply with the electrocautery unit area and the same procedure was repeated on the inferior aspect of the fascial incision. The rectus muscles were separated in the midline. The  abdominal peritoneum was identified, tented up, entered sharply, and the incision was extended superiorly and inferiorly with good visualization of the bladder. The Alexis retractor was then deployed. The vesicouterine peritoneum was identified, tented up, entered sharply, and the bladder flap was created digitally. Scalpel was then used to make a low transverse incision on the uterus which was extended laterally with blunt dissection. The fetal vertex was identified, delivered easily through the uterine incision with the assistance of the vacuum. The infant's body was then delivered. The infant was bulb suctioned on the operative field cried vigorously, cord was clamped and cut and the infant was passed to the waiting neonatologist. Placenta was then delivered spontaneously, the uterus was cleared of all clot and debris. The uterine incision was repaired with #1 chromic in running locked fashion. Ovaries and tubes were inspected and normal. Attention was turned to the fallopian tubes. A Babcock clamp was used to grasp the right fallopian tube. A clear space in the mesosalpinx was identified and the Filshie clip was applied circumferentially around the fallopian tube. The same procedure was repeated on the left fallopian tube.The abdominal cavity was cleared of all clot and debris. The Alexis retractor was removed. The abdominal peritoneum was reapproximated with 2-0 Vicryl in a running fashion, the rectus muscles was reapproximated with #1 chromic in a running fashion. The fascia was closed with a looped PDS in a running fashion. The subcutaneous tissue was reapproximated with 2-0 plain gut interrupted stitches. The skin was closed with 4-0 vicryl in a subcuticular fashion and Dermabond. All sponge lap and needle counts were correct x2. Patient tolerated the procedure well and recovered in stable condition following the procedure.

## 2013-09-11 NOTE — H&P (Signed)
Andrea Hayden is a 42 y.o. female presenting for elevated blood pressures  41 Yo G5P1031 @ 35+2 presents to MAU for evaluation of blood pressures. The patient has been followed for type 1 diabetes mellitus and has been noted to have elevated BPs since 33 weeks. Her BPs have been steadily increasing. Today in MAU BPs were 150-160s/ 80-90s.  Pre-eclampsia labs were WNL, Urine showed 1+ protein.  The patient's case was discussed with Dr. Katherina Right, MFM and he agreed with the decision to proceed with delivery.   EFW 9/12 3172 gm (7#0) AFI 22   History OB History   Grav Para Term Preterm Abortions TAB SAB Ect Mult Living   5 1 1  0 3 0 3 0 0 1     Past Medical History  Diagnosis Date  . Diabetes mellitus without complication     Type 1: external insulin pump   Past Surgical History  Procedure Laterality Date  . Cholecystectomy  2010  . Cesarean section    . Dilation and curettage of uterus     Family History: family history includes Hypertension in her father, mother, paternal grandfather, and paternal grandmother. Social History:  reports that she has never smoked. She does not have any smokeless tobacco history on file. She reports that she does not drink alcohol or use illicit drugs.   Prenatal Transfer Tool  Maternal Diabetes: Yes:  Diabetes Type:  Pre-pregnancy, Insulin/Medication controlled Genetic Screening: Normal Maternal Ultrasounds/Referrals: Normal Fetal Ultrasounds or other Referrals:  Fetal echo, suboptimal but normal Maternal Substance Abuse:  No Significant Maternal Medications:  None Significant Maternal Lab Results:  None Other Comments:  None  ROS: as above    Blood pressure 156/87, pulse 79, temperature 98 F (36.7 C), temperature source Oral, resp. rate 20, height 5\' 4"  (1.626 m), weight 103.874 kg (229 lb), last menstrual period 01/07/2013, SpO2 100.00%. Exam Physical Exam  Prenatal labs: ABO, Rh: --/--/A POS, A POS (09/15 1228) Antibody: NEG (09/15  1228) Rubella:  Immune RPR:   NR HBsAg:   Neg HIV:   NR GBS:     Assessment/Plan: 1) Admit 2) Consent for repeat cesarean section. Patient and husband also desire tubal ligation. R/B/A reviewed at length.  3) T&C for 2 units 4) CBG Q2, D5LR @125  5) Magnesium sulfate for seizure prophylaxis  Andrea Hayden H. 09/11/2013, 5:58 PM

## 2013-09-11 NOTE — Anesthesia Postprocedure Evaluation (Signed)
  Anesthesia Post-op Note  Patient: Andrea Hayden  Procedure(s) Performed: Procedure(s): Repeat CESAREAN SECTION of baby boy at 27 APGAR  WITH BILATERAL TUBAL LIGATION (N/A)  Patient Location: PACU  Anesthesia Type:Spinal  Level of Consciousness: awake, alert  and oriented  Airway and Oxygen Therapy: Patient Spontanous Breathing  Post-op Pain: none  Post-op Assessment: Post-op Vital signs reviewed, Patient's Cardiovascular Status Stable, Respiratory Function Stable, Patent Airway, No signs of Nausea or vomiting, Pain level controlled, No headache and No backache  Post-op Vital Signs: Reviewed and stable  Complications: No apparent anesthesia complications

## 2013-09-11 NOTE — MAU Note (Signed)
Patient states she has had elevated blood pressure for a while. States she took it at home today and was elevated. Denies headache but feels fullness in her head. Denies leaking or bleeding. States she has some mild contractions and reports good fetal movement.

## 2013-09-11 NOTE — MAU Note (Signed)
Scheduled for repeat cesarean section on 10-8

## 2013-09-11 NOTE — Anesthesia Preprocedure Evaluation (Addendum)
Anesthesia Evaluation  Patient identified by MRN, date of birth, ID band Patient awake    Reviewed: Allergy & Precautions, H&P , NPO status , Patient's Chart, lab work & pertinent test results, reviewed documented beta blocker date and time   History of Anesthesia Complications Negative for: history of anesthetic complications  Airway Mallampati: I TM Distance: >3 FB Neck ROM: full    Dental  (+) Teeth Intact   Pulmonary neg pulmonary ROS,  breath sounds clear to auscultation        Cardiovascular hypertension (severe preeclampsia), Rhythm:regular Rate:Normal     Neuro/Psych negative neurological ROS  negative psych ROS   GI/Hepatic negative GI ROS, Neg liver ROS,   Endo/Other  diabetes (insulin pump - turned off right now because glucose running in 60s, will remove CGM for surgery (has metal filament)), Type 1, Insulin DependentMorbid obesity  Renal/GU negative Renal ROS     Musculoskeletal   Abdominal   Peds  Hematology negative hematology ROS (+)   Anesthesia Other Findings Ate full lunch at 11 am  Reproductive/Obstetrics (+) Pregnancy (h/o c/s, for repeat)                          Anesthesia Physical Anesthesia Plan  ASA: III  Anesthesia Plan: Spinal   Post-op Pain Management:    Induction:   Airway Management Planned:   Additional Equipment:   Intra-op Plan:   Post-operative Plan:   Informed Consent: I have reviewed the patients History and Physical, chart, labs and discussed the procedure including the risks, benefits and alternatives for the proposed anesthesia with the patient or authorized representative who has indicated his/her understanding and acceptance.     Plan Discussed with: Surgeon and CRNA  Anesthesia Plan Comments:         Anesthesia Quick Evaluation

## 2013-09-11 NOTE — Transfer of Care (Signed)
Immediate Anesthesia Transfer of Care Note  Patient: Andrea Hayden  Procedure(s) Performed: Procedure(s): Repeat CESAREAN SECTION of baby boy at 35 APGAR  WITH BILATERAL TUBAL LIGATION (N/A)  Patient Location: PACU  Anesthesia Type:Spinal  Level of Consciousness: awake, alert  and oriented  Airway & Oxygen Therapy: Patient Spontanous Breathing  Post-op Assessment: Report given to PACU RN and Post -op Vital signs reviewed and stable  Post vital signs: stable  Complications: No apparent anesthesia complications

## 2013-09-11 NOTE — MAU Provider Note (Signed)
History     CSN: [id number]  Arrival date and time: 09/11/13 1036   First Provider Initiated Contact with Patient 09/11/13 1140      Chief Complaint  Patient presents with  . Hypertension   HPI  Andrea Hayden is a 42 y.o. female [phone] at [redacted]w[redacted]d who presents to MAU with elevated BP readings at home. She was here two weeks ago for contractions and elevated BP readings. She is scheduled for a repeat C-section on Oct 8. She is having contractions, however does not feel like she is feeling them all. She reports good fetal movement, denies LOF, vaginal bleeding, vaginal itching/burning, urinary symptoms, n/v, or fever/chills; mild headache and mild dizziness.   OB History   Grav Para Term Preterm Abortions TAB SAB Ect Mult Living   5 1 1  0 3 0 3 0 0 1      Past Medical History  Diagnosis Date  . Diabetes mellitus without complication     Type 1: external insulin pump    Past Surgical History  Procedure Laterality Date  . Cholecystectomy  2010  . Cesarean section    . Dilation and curettage of uterus      Family History  Problem Relation Age of Onset  . Hypertension Mother   . Hypertension Father   . Hypertension Paternal Grandmother   . Hypertension Paternal Grandfather     History  Substance Use Topics  . Smoking status: Never Smoker   . Smokeless tobacco: Not on file  . Alcohol Use: No    Allergies: No Known Allergies  Prescriptions prior to admission  Medication Sig Dispense Refill  . acetaminophen (TYLENOL) 325 MG tablet Take 650 mg by mouth daily as needed for pain.      . Insulin Human (INSULIN PUMP) 100 unit/ml SOLN Inject 1 each into the skin See admin instructions. Insulin pump, Novolog insulin      . Prenatal Vit-Fe Fumarate-FA (PRENATAL MULTIVITAMIN) TABS tablet Take 1 tablet by mouth daily at 12 noon.       Results for orders placed during the hospital encounter of 09/11/13 (from the past 24 hour(s))  URINALYSIS, ROUTINE W REFLEX MICROSCOPIC      Status: Abnormal   Collection Time    09/11/13 11:05 AM      Result Value Range   Color, Urine YELLOW  YELLOW   APPearance CLEAR  CLEAR   Specific Gravity, Urine 1.020  1.005 - 1.030   pH 6.0  5.0 - 8.0   Glucose, UA NEGATIVE  NEGATIVE mg/dL   Hgb urine dipstick TRACE (*) NEGATIVE   Bilirubin Urine NEGATIVE  NEGATIVE   Ketones, ur NEGATIVE  NEGATIVE mg/dL   Protein, ur 010 (*) NEGATIVE mg/dL   Urobilinogen, UA 0.2  0.0 - 1.0 mg/dL   Nitrite NEGATIVE  NEGATIVE   Leukocytes, UA NEGATIVE  NEGATIVE  URINE MICROSCOPIC-ADD ON     Status: None   Collection Time    09/11/13 11:05 AM      Result Value Range   Squamous Epithelial / LPF RARE  RARE   WBC, UA 0-2  <3 WBC/hpf   RBC / HPF 0-2  <3 RBC/hpf  CBC     Status: None   Collection Time    09/11/13 11:40 AM      Result Value Range   WBC 9.6  4.0 - 10.5 K/uL   RBC 4.18  3.87 - 5.11 MIL/uL   Hemoglobin 12.5  12.0 - 15.0 g/dL  HCT 36.1  36.0 - 46.0 %   MCV 86.4  78.0 - 100.0 fL   MCH 29.9  26.0 - 34.0 pg   MCHC 34.6  30.0 - 36.0 g/dL   RDW 16.1  09.6 - 04.5 %   Platelets 183  150 - 400 K/uL  COMPREHENSIVE METABOLIC PANEL     Status: Abnormal   Collection Time    09/11/13 11:40 AM      Result Value Range   Sodium 133 (*) 135 - 145 mEq/L   Potassium 3.8  3.5 - 5.1 mEq/L   Chloride 101  96 - 112 mEq/L   CO2 20  19 - 32 mEq/L   Glucose, Bld 125 (*) 70 - 99 mg/dL   BUN 8  6 - 23 mg/dL   Creatinine, Ser 4.09  0.50 - 1.10 mg/dL   Calcium 9.1  8.4 - 81.1 mg/dL   Total Protein 6.1  6.0 - 8.3 g/dL   Albumin 2.3 (*) 3.5 - 5.2 g/dL   AST 15  0 - 37 U/L   ALT 12  0 - 35 U/L   Alkaline Phosphatase 148 (*) 39 - 117 U/L   Total Bilirubin 0.2 (*) 0.3 - 1.2 mg/dL   GFR calc non Af Amer >90  >90 mL/min   GFR calc Af Amer >90  >90 mL/min  LACTATE DEHYDROGENASE     Status: None   Collection Time    09/11/13 11:40 AM      Result Value Range   LDH 185  94 - 250 U/L  URIC ACID     Status: None   Collection Time    09/11/13 11:40 AM       Result Value Range   Uric Acid, Serum 4.8  2.4 - 7.0 mg/dL  TYPE AND SCREEN     Status: None   Collection Time    09/11/13 12:28 PM      Result Value Range   ABO/RH(D) A POS     Antibody Screen NEG     Sample Expiration 09/14/2013    ABO/RH     Status: None   Collection Time    09/11/13 12:28 PM      Result Value Range   ABO/RH(D) A POS      Review of Systems  Constitutional: Negative for fever and chills.  HENT:       Occasional HA  Eyes: Negative for blurred vision, double vision and photophobia.  Cardiovascular: Positive for leg swelling.  Gastrointestinal: Positive for nausea. Negative for vomiting, abdominal pain, diarrhea and constipation.       Feels an occasional contraction No right upper quadrant pain   Neurological: Positive for dizziness and headaches. Negative for seizures and weakness.   Physical Exam   Blood pressure 166/91, pulse 82, temperature 98.3 F (36.8 C), temperature source Oral, resp. rate 20, height 5\' 3"  (1.6 m), weight 103.874 kg (229 lb), last menstrual period 01/07/2013, SpO2 100.00%.  Physical Exam  Constitutional: She is oriented to person, place, and time. She appears well-developed and well-nourished. No distress.  HENT:  Head: Normocephalic.  Neck: Neck supple.  Respiratory: Effort normal.  GI: Soft. There is no tenderness. There is no rebound and no guarding.  No right upper quadrant tenderness on exam   Neurological: She is alert and oriented to person, place, and time. She has normal strength.  Reflex Scores:      Patellar reflexes are 1+ on the right side and 1+ on the left  side. Negative for clonus bilaterally   Skin: Skin is warm and dry. She is not diaphoretic.  Psychiatric: Her behavior is normal.    Fetal Tracing: Baseline: 135 bpm Variability: moderate  Accelerations: 15X15 Decelerations: none  Toco: contractions 3-5 mins apart; irregular with UI   MAU Course  Procedures  MDM UA CBC CMET LDH Uric  Acid Serial blood pressure readings Procardia 10 mg PO times one dose   Consulted with Dr. Tenny Craw at 1230; will plan to start IV and keep pt NPO. I will call Dr. Tenny Craw when remainder of labs are back.  Consulted with Dr. Tenny Craw at 1330; plan to admit patient to Antenatal, NPO past midnight and 24 hour urine collection.  Dr. Tenny Craw called back and will plan to deliver the patient via c-section later this evening; pt notified and agreed with plan of care.   Assessment and Plan   Admit to Antenatal unit with plans for repeat C-Section later this afternoon.  Mehdi Gironda IRENE FNP-C 09/11/2013, 2:25 PM

## 2013-09-12 ENCOUNTER — Encounter (HOSPITAL_COMMUNITY): Payer: Self-pay | Admitting: Obstetrics and Gynecology

## 2013-09-12 LAB — GLUCOSE, CAPILLARY
Glucose-Capillary: 120 mg/dL — ABNORMAL HIGH (ref 70–99)
Glucose-Capillary: 210 mg/dL — ABNORMAL HIGH (ref 70–99)
Glucose-Capillary: 156 mg/dL — ABNORMAL HIGH (ref 70–99)
Glucose-Capillary: 48 mg/dL — ABNORMAL LOW (ref 70–99)

## 2013-09-12 LAB — CBC
HCT: 34 % — ABNORMAL LOW (ref 36.0–46.0)
MCV: 87.6 fL (ref 78.0–100.0)
RDW: 14.2 % (ref 11.5–15.5)
WBC: 12.2 10*3/uL — ABNORMAL HIGH (ref 4.0–10.5)

## 2013-09-12 MED ORDER — KETOROLAC TROMETHAMINE 30 MG/ML IJ SOLN: 30.0000 mg | Freq: Four times a day (QID) | INTRAMUSCULAR | Status: DC | PRN

## 2013-09-12 MED ORDER — FENTANYL CITRATE 0.05 MG/ML IJ SOLN
INTRAMUSCULAR | Status: AC
  Filled 2013-09-12: qty 2

## 2013-09-12 MED ORDER — TETANUS-DIPHTH-ACELL PERTUSSIS 5-2.5-18.5 LF-MCG/0.5 IM SUSP
0.5000 mL | Freq: Once | INTRAMUSCULAR | Status: AC
  Administered 2013-09-12: 0.5 mL via INTRAMUSCULAR
  Filled 2013-09-12: qty 0.5

## 2013-09-12 MED ORDER — DIPHENHYDRAMINE HCL 25 MG PO CAPS: 25.0000 mg | ORAL_CAPSULE | Freq: Four times a day (QID) | ORAL | Status: DC | PRN

## 2013-09-12 MED ORDER — DIPHENHYDRAMINE HCL 50 MG/ML IJ SOLN: 25.0000 mg | INTRAMUSCULAR | Status: DC | PRN

## 2013-09-12 MED ORDER — IBUPROFEN 600 MG PO TABS
600.0000 mg | ORAL_TABLET | Freq: Four times a day (QID) | ORAL | Status: DC
  Administered 2013-09-12 – 2013-09-15 (×15): 600 mg via ORAL
  Filled 2013-09-12 (×15): qty 1

## 2013-09-12 MED ORDER — SODIUM CHLORIDE 0.9 % IJ SOLN: 3.0000 mL | INTRAMUSCULAR | Status: DC | PRN

## 2013-09-12 MED ORDER — NALOXONE HCL 1 MG/ML IJ SOLN
1.0000 ug/kg/h | INTRAVENOUS | Status: DC | PRN
  Filled 2013-09-12: qty 2

## 2013-09-12 MED ORDER — METOCLOPRAMIDE HCL 5 MG/ML IJ SOLN: 10.0000 mg | Freq: Three times a day (TID) | INTRAMUSCULAR | Status: DC | PRN

## 2013-09-12 MED ORDER — NALOXONE HCL 0.4 MG/ML IJ SOLN: 0.4000 mg | INTRAMUSCULAR | Status: DC | PRN

## 2013-09-12 MED ORDER — NALBUPHINE SYRINGE 5 MG/0.5 ML
5.0000 mg | INJECTION | INTRAMUSCULAR | Status: DC | PRN
  Filled 2013-09-12: qty 1

## 2013-09-12 MED ORDER — LACTATED RINGERS IV SOLN
INTRAVENOUS | Status: DC
  Administered 2013-09-12 – 2013-09-13 (×3): via INTRAVENOUS

## 2013-09-12 MED ORDER — INSULIN PUMP
1.0000 | SUBCUTANEOUS | Status: DC
  Administered 2013-09-12 – 2013-09-13 (×8): 1 via SUBCUTANEOUS

## 2013-09-12 MED ORDER — WITCH HAZEL-GLYCERIN EX PADS: 1.0000 "application " | MEDICATED_PAD | CUTANEOUS | Status: DC | PRN

## 2013-09-12 MED ORDER — OXYCODONE-ACETAMINOPHEN 5-325 MG PO TABS
1.0000 | ORAL_TABLET | ORAL | Status: DC | PRN
  Administered 2013-09-12 – 2013-09-13 (×5): 2 via ORAL
  Administered 2013-09-13: 1 via ORAL
  Administered 2013-09-13: 2 via ORAL
  Administered 2013-09-13 – 2013-09-14 (×2): 1 via ORAL
  Administered 2013-09-14 – 2013-09-15 (×2): 2 via ORAL
  Filled 2013-09-12: qty 1
  Filled 2013-09-12 (×4): qty 2
  Filled 2013-09-12 (×3): qty 1
  Filled 2013-09-12 (×3): qty 2

## 2013-09-12 MED ORDER — PRENATAL MULTIVITAMIN CH
1.0000 | ORAL_TABLET | Freq: Every day | ORAL | Status: DC
  Administered 2013-09-12 – 2013-09-14 (×3): 1 via ORAL
  Filled 2013-09-12 (×3): qty 1

## 2013-09-12 MED ORDER — SIMETHICONE 80 MG PO CHEW
80.0000 mg | CHEWABLE_TABLET | ORAL | Status: DC
  Administered 2013-09-12 – 2013-09-14 (×3): 80 mg via ORAL

## 2013-09-12 MED ORDER — DIPHENHYDRAMINE HCL 50 MG/ML IJ SOLN: 12.5000 mg | INTRAMUSCULAR | Status: DC | PRN

## 2013-09-12 MED ORDER — SIMETHICONE 80 MG PO CHEW
80.0000 mg | CHEWABLE_TABLET | Freq: Three times a day (TID) | ORAL | Status: DC
  Administered 2013-09-12 – 2013-09-15 (×8): 80 mg via ORAL

## 2013-09-12 MED ORDER — PNEUMOCOCCAL VAC POLYVALENT 25 MCG/0.5ML IJ INJ
0.5000 mL | INJECTION | INTRAMUSCULAR | Status: AC
  Filled 2013-09-12: qty 0.5

## 2013-09-12 MED ORDER — ZOLPIDEM TARTRATE 5 MG PO TABS
5.0000 mg | ORAL_TABLET | Freq: Every evening | ORAL | Status: DC | PRN
  Administered 2013-09-14: 5 mg via ORAL
  Filled 2013-09-12: qty 1

## 2013-09-12 MED ORDER — SENNOSIDES-DOCUSATE SODIUM 8.6-50 MG PO TABS
2.0000 | ORAL_TABLET | ORAL | Status: DC
  Administered 2013-09-12 – 2013-09-14 (×3): 2 via ORAL

## 2013-09-12 MED ORDER — INFLUENZA VAC SPLIT QUAD 0.5 ML IM SUSP
0.5000 mL | INTRAMUSCULAR | Status: AC
  Administered 2013-09-13: 0.5 mL via INTRAMUSCULAR
  Filled 2013-09-12: qty 0.5

## 2013-09-12 MED ORDER — OXYTOCIN 40 UNITS IN LACTATED RINGERS INFUSION - SIMPLE MED: 62.5000 mL/h | INTRAVENOUS | Status: AC

## 2013-09-12 MED ORDER — MENTHOL 3 MG MT LOZG: 1.0000 | LOZENGE | OROMUCOSAL | Status: DC | PRN

## 2013-09-12 MED ORDER — DIPHENHYDRAMINE HCL 25 MG PO CAPS: 25.0000 mg | ORAL_CAPSULE | ORAL | Status: DC | PRN

## 2013-09-12 MED ORDER — SIMETHICONE 80 MG PO CHEW
80.0000 mg | CHEWABLE_TABLET | ORAL | Status: DC | PRN
  Administered 2013-09-12 – 2013-09-13 (×2): 80 mg via ORAL

## 2013-09-12 MED ORDER — ONDANSETRON HCL 4 MG/2ML IJ SOLN: 4.0000 mg | INTRAMUSCULAR | Status: DC | PRN

## 2013-09-12 MED ORDER — LANOLIN HYDROUS EX OINT: 1.0000 "application " | TOPICAL_OINTMENT | CUTANEOUS | Status: DC | PRN

## 2013-09-12 MED ORDER — ONDANSETRON HCL 4 MG PO TABS: 4.0000 mg | ORAL_TABLET | ORAL | Status: DC | PRN

## 2013-09-12 MED ORDER — ONDANSETRON HCL 4 MG/2ML IJ SOLN: 4.0000 mg | Freq: Three times a day (TID) | INTRAMUSCULAR | Status: DC | PRN

## 2013-09-12 MED ORDER — DIBUCAINE 1 % RE OINT: 1.0000 "application " | TOPICAL_OINTMENT | RECTAL | Status: DC | PRN

## 2013-09-12 MED FILL — Insulin Aspart Inj 100 Unit/ML: SUBCUTANEOUS | Qty: 10 | Status: AC

## 2013-09-12 NOTE — Anesthesia Postprocedure Evaluation (Signed)
  Anesthesia Post-op Note  Anesthesia Post Note  Patient: Andrea Hayden  Procedure(s) Performed: Procedure(s) (LRB): Repeat CESAREAN SECTION of baby boy at 67 APGAR  WITH BILATERAL TUBAL LIGATION (N/A)  Anesthesia type: Spinal  Patient location: AICU  Post pain: Pain level controlled  Post assessment: Post-op Vital signs reviewed  Last Vitals:  Filed Vitals:   09/12/13 0700  BP: 158/81  Pulse: 71  Temp:   Resp:     Post vital signs: Reviewed  Level of consciousness: awake  Complications: No apparent anesthesia complications

## 2013-09-12 NOTE — Progress Notes (Signed)
Patient reported her CBG was 104 at 1055, then she ate 48gms of carbs and has a total of 34.3 units of insulin given

## 2013-09-12 NOTE — Progress Notes (Signed)
BS obtained - 202, 1.4 units per Glucostabilizer orders.  Pt reapplied her own insulin pump - suspended at this time.  Insulin pump contract obtained.

## 2013-09-12 NOTE — Progress Notes (Signed)
CSW will meet with pt to complete an assessment once magnesium is stopped.

## 2013-09-12 NOTE — Lactation Note (Signed)
This note was copied from the chart of Andrea Hayden. Lactation Consultation Note   Initial consult with this mom of a NICU baby, now 20 hours post partum, and 35 3/[redacted] weeks gestation. Baby is IDM, LGA, mom diabetic on insulin pump, with PIH also, in AICU on Magnesium. Mom had pumped once, no expressed colostrum. Mom was sleeping, so I briefly showed her hand expression, and expressed a few small drops to bring to her baby. I showed mom how to set premie setting, and increased her to 27 flange. Mom has a pump circle/bruise on her right areola, from improper flange placement. I told mom I would review more teaching with her tomorrow, and for her to sleep for now.   Patient Name: Andrea Hayden [id number] Date: 09/12/2013 Reason for consult: Initial assessment;NICU baby   Maternal Data Formula Feeding for Exclusion: Yes (baby in NICU) Reason for exclusion: Admission to Intensive Care Unit (ICU) post-partum Infant to breast within first hour of birth: No Breastfeeding delayed due to:: Infant status;Maternal status Has patient been taught Hand Expression?: Yes Does the patient have breastfeeding experience prior to this delivery?: Yes  Feeding Feeding Type: Formula Nipple Type: Regular  LATCH Score/Interventions                      Lactation Tools Discussed/Used Tools: Pump Breast pump type: Double-Electric Breast Pump Pump Review: Milk Storage;Other (comment);Setup, frequency, and cleaning (NICU book on EB, hand expression) Initiated by:: bedside rn not within 6 hours of delivery Date initiated:: 09/12/13   Consult Status Consult Status: Follow-up Date: 09/13/13 Follow-up type: In-patient    Alfred Levins 09/12/2013, 4:20 PM

## 2013-09-12 NOTE — Progress Notes (Signed)
UR chart review completed.  

## 2013-09-12 NOTE — Progress Notes (Addendum)
  Patient is eating, ambulating, not voiding yet- foley in with clear urine.  Pain control is good.  Filed Vitals:   09/12/13 0424 09/12/13 0500 09/12/13 0600 09/12/13 0700  BP:  151/79 154/85 158/81  Pulse:  75 73 71  Temp: 98.6 F (37 C)     TempSrc: Oral     Resp: 18 18 18    Height:      Weight:   96.026 kg (211 lb 11.2 oz)   SpO2: 95% 96% 95% 98%    lungs:   clear to auscultation cor:    RRR Abdomen:  soft, appropriate tenderness, incisions intact and without erythema or exudate ex:    no cords   Lab Results  Component Value Date   WBC 12.2* 09/12/2013   HGB 11.9* 09/12/2013   HCT 34.0* 09/12/2013   MCV 87.6 09/12/2013   PLT 181 09/12/2013    --/--/A POS, A POS (09/15 1228)/RI  A/P    Post operative day 0.5.   1. Insulin dependent DM.  Sugars 125, 106 post op on insulin pump.  2. Severe preeclapmsia- continue Magnesium sulfate for at least 24 hours.  Routine post op and postpartum care.  Expect d/c in 2 to three days.  Percocet for pain control. Baby stable in NICU.

## 2013-09-12 NOTE — Progress Notes (Signed)
Report to Lenor Coffin, RN - diabetic coordinator re POC.  Insulin pump restarted with planned basal rate.

## 2013-09-12 NOTE — Anesthesia Postprocedure Evaluation (Deleted)
  Anesthesia Post-op Note  Anesthesia Post Note  Patient: Andrea Hayden  Procedure(s) Performed: Procedure(s) (LRB): Repeat CESAREAN SECTION of baby boy at 26 APGAR  WITH BILATERAL TUBAL LIGATION (N/A)  Anesthesia type: Spinal  Patient location: Mother/Baby  Post pain: Pain level controlled  Post assessment: Post-op Vital signs reviewed  Last Vitals:  Filed Vitals:   09/12/13 0700  BP: 158/81  Pulse: 71  Temp:   Resp:     Post vital signs: Reviewed  Level of consciousness: awake  Complications: No apparent anesthesia complications

## 2013-09-13 LAB — GLUCOSE, CAPILLARY
Glucose-Capillary: 91 mg/dL (ref 70–99)
Glucose-Capillary: 126 mg/dL — ABNORMAL HIGH (ref 70–99)
Glucose-Capillary: 87 mg/dL (ref 70–99)

## 2013-09-13 LAB — RPR: RPR Ser Ql: NONREACTIVE

## 2013-09-13 MED ORDER — LABETALOL HCL 200 MG PO TABS
200.0000 mg | ORAL_TABLET | Freq: Three times a day (TID) | ORAL | Status: DC
  Administered 2013-09-13 – 2013-09-15 (×7): 200 mg via ORAL
  Filled 2013-09-13 (×10): qty 1

## 2013-09-13 MED ORDER — SODIUM CHLORIDE 0.9 % IJ SOLN: 3.0000 mL | INTRAMUSCULAR | Status: DC | PRN

## 2013-09-13 MED ORDER — SODIUM CHLORIDE 0.9 % IJ SOLN
3.0000 mL | Freq: Two times a day (BID) | INTRAMUSCULAR | Status: DC
  Administered 2013-09-13: 3 mL via INTRAVENOUS

## 2013-09-13 NOTE — Progress Notes (Signed)
Pt transferred ambulatory to [facility] Rm #[id number] - report to be given to Allycha.

## 2013-09-13 NOTE — Progress Notes (Signed)
Pt called out to recheck her blood sugar after returning from NICU - pt stated that she felt hot, sweaty, & jittery - CBG=52 (was 82 just prior to going to NICU). Pt requested peanut butter, saltines, & apple juice. Will recheck CBG in 15 mins.

## 2013-09-13 NOTE — Progress Notes (Signed)
Patient is eating, ambulating to bathroom, voiding.  Pain control is good.  She denies HA, vision change, RUQ pain.  Overall feeling better.  Filed Vitals:   09/13/13 0646 09/13/13 0700 09/13/13 0800 09/13/13 0900  BP:  173/73 159/77 177/81  Pulse:  74 78 75  Temp:   98.8 F (37.1 C)   TempSrc:   Oral   Resp:   16   Height:      Weight: 213 lb 4.8 oz (96.752 kg)     SpO2:  97% 98% 99%    [employer]: c/d/i Ext: no CT  Lab Results  Component Value Date   WBC 12.2* 09/12/2013   HGB 11.9* 09/12/2013   HCT 34.0* 09/12/2013   MCV 87.6 09/12/2013   PLT 181 09/12/2013    --/--/A POS, A POS (09/15 1228)  A/P Post op day #2 s/p R c/s for severe precclampsia. BPs continue to be severe range to mild range, will start Labetalol 200mg  tid now, monitor through morning and early afternoon.  If BPs come down to mild range will d/c mag sulfate and initiate transfer to postpartum. Baby doing well in NICU - blood sugar issues being treated Pt is using her insulin pump and has transition back to pre-pregnancy rates, some low BS readings.  Routine care.    Philip Aspen

## 2013-09-13 NOTE — Lactation Note (Signed)
This note was copied from the chart of Andrea Jurnie Garritano. Lactation Consultation Note   I assisted mom with breat feeding in the nICU. The baby latched wel, with a few intermittent , soft suckles. Mom was having some incision pain, so i dressed the baby so big sister could hold baby. I will follow this family in the NICU.   Patient Name: Andrea Hayden [id number]'U Date: 09/13/2013 Reason for consult: Follow-up assessment;NICU baby   Maternal Data    Feeding Feeding Type: Breast Milk Nipple Type: Regular Length of feed: 10 min (on and off suckles, soft, sleepy)  LATCH Score/Interventions Latch: Repeated attempts needed to sustain latch, nipple held in mouth throughout feeding, stimulation needed to elicit sucking reflex. Intervention(s): Adjust position;Assist with latch;Breast compression  Audible Swallowing: None  Type of Nipple: Everted at rest and after stimulation  Comfort (Breast/Nipple): Soft / non-tender     Hold (Positioning): Assistance needed to correctly position infant at breast and maintain latch. Intervention(s): Breastfeeding basics reviewed;Support Pillows;Position options;Skin to skin  LATCH Score: 6  Lactation Tools Discussed/Used     Consult Status Consult Status: Follow-up Date: 09/14/13 Follow-up type: In-patient    Alfred Levins 09/13/2013, 6:03 PM

## 2013-09-13 NOTE — Lactation Note (Signed)
This note was copied from the chart of Andrea Amaira Safley. Lactation Consultation Note    Follow up consult with this mom of a NICU baby. Mom is on an insulin pump, and is having trouble stabilizing her blood sugars today. She is very tired, but excited that she is expressing drops of colostrum from each breast. i assisted her with hand expression, and expressed a few drops of clear colostrum. I advised mom to keep pumping as tolerated, and that with her diabested, surgery, etc, her milk transition may be delayed a day or two. i advised her to sleep tonight for as long as she can, and to pump if she feels up to it.    Mom will be transferred to the women's unit tonight some time.   Patient Name: Andrea Hayden'X Date: 09/13/2013 Reason for consult: Follow-up assessment;NICU baby   Maternal Data    Feeding Feeding Type: Breast Milk Nipple Type: Regular Length of feed: 10 min (on and off suckles, soft, sleepy)  LATCH Score/Interventions Latch: Repeated attempts needed to sustain latch, nipple held in mouth throughout feeding, stimulation needed to elicit sucking reflex. Intervention(s): Adjust position;Assist with latch;Breast compression  Audible Swallowing: None  Type of Nipple: Everted at rest and after stimulation  Comfort (Breast/Nipple): Soft / non-tender     Hold (Positioning): Assistance needed to correctly position infant at breast and maintain latch. Intervention(s): Breastfeeding basics reviewed;Support Pillows;Position options;Skin to skin  LATCH Score: 6  Lactation Tools Discussed/Used     Consult Status Consult Status: Follow-up Date: 09/14/13 Follow-up type: In-patient    Alfred Levins 09/13/2013, 6:38 PM

## 2013-09-14 LAB — GLUCOSE, CAPILLARY
Glucose-Capillary: 83 mg/dL (ref 70–99)
Glucose-Capillary: 120 mg/dL — ABNORMAL HIGH (ref 70–99)
Glucose-Capillary: 174 mg/dL — ABNORMAL HIGH (ref 70–99)
Glucose-Capillary: 93 mg/dL (ref 70–99)

## 2013-09-14 MED ORDER — LABETALOL HCL 200 MG PO TABS: 200.0000 mg | ORAL_TABLET | Freq: Three times a day (TID) | ORAL | Status: DC

## 2013-09-14 MED ORDER — OXYCODONE-ACETAMINOPHEN 5-325 MG PO TABS: 1.0000 | ORAL_TABLET | ORAL | Status: DC | PRN

## 2013-09-14 NOTE — Progress Notes (Addendum)
  Patient is eating, ambulating, voiding.  Pain control is good.  Filed Vitals:   09/13/13 2219 09/14/13 0209 09/14/13 0500 09/14/13 0710  BP: 161/80 179/84 146/87   Pulse: 88 82 78   Temp: 98.7 F (37.1 C) 98.6 F (37 C) 98.4 F (36.9 C)   TempSrc: Oral Oral Oral   Resp: 18 18 18    Height:      Weight:    96.616 kg (213 lb)  SpO2: 97% 97% 98%     lungs:   clear to auscultation cor:    RRR Abdomen:  soft, appropriate tenderness, incisions intact and without erythema or exudate ex:    no cords   Lab Results  Component Value Date   WBC 12.2* 09/12/2013   HGB 11.9* 09/12/2013   HCT 34.0* 09/12/2013   MCV 87.6 09/12/2013   PLT 181 09/12/2013    --/--/A POS, A POS (09/15 1228)/RI  A/P    Post operative day 3.  Routine post op and postpartum care.  Expect d/c tomorrow- still checking BPs and working on pain control.  Percocet for pain control. BPs currently stable on labetalol but were elevated earlier this am. Pt also trying to adjust insulin pump correctly.

## 2013-09-14 NOTE — Discharge Summary (Signed)
Obstetric Discharge Summary Reason for Admission: cesarean section Prenatal Procedures: Preeclampsia Intrapartum Procedures: cesarean: low cervical, transverse Postpartum Procedures: none Complications-Operative and Postpartum: none Hemoglobin  Date Value Range Status  09/12/2013 11.9* 12.0 - 15.0 g/dL Final     HCT  Date Value Range Status  09/12/2013 34.0* 36.0 - 46.0 % Final   Hospital Course: Insulin dep diabetic on pump admitted for repeat C/S at term for severe preeclampsia.  Pt underwent uncomplicated delivery and post op course.  After receiving magnesium pp, she was placed on labetalol for increased BPs.  Pt responded to treatment.  Discharge Diagnoses: Term Pregnancy-delivered and Preelampsia  Discharge Information: Date: 09/14/2013 Activity: pelvic rest Diet: routine Medications: Percocet and labetalol Condition: stable Instructions: refer to practice specific booklet Discharge to: home Follow-up Information   Follow up with Almon Hercules., MD In 1 week.   Specialty:  Obstetrics and Gynecology   Contact information:   [address] [place] [address] [phone]       Newborn Data: Live born female  Birth Weight: 7 lb 11.6 oz (3503 g) APGAR: 8, 9  Baby stable in NICU.  Soleil Mas A 09/14/2013, 8:13 AM

## 2013-09-14 NOTE — Progress Notes (Signed)
Pt called out to the nursing station, stated she checked her blood sugar, CBG = 52. Pt stated she was in bed and rolled over feeling clammy and sweaty and decided to check her blood sugar. CBG was verified with hospital's glucometer, CBG=52. Pt ate peanut butter and graham crackers with apple juice. CBG checked 15 mins later, resulted 120.

## 2013-09-15 ENCOUNTER — Encounter (HOSPITAL_COMMUNITY)
Admission: RE | Admit: 2013-09-15 | Discharge: 2013-09-15 | Disposition: A | Discharge status: Home / Self Care | Payer: BC Managed Care – PPO | Source: Ambulatory Visit | Attending: Obstetrics & Gynecology | Admitting: Obstetrics & Gynecology

## 2013-09-15 DIAGNOSIS — O923 Agalactia: Secondary | ICD-10-CM | POA: Insufficient documentation

## 2013-09-15 LAB — TYPE AND SCREEN
Antibody Screen: NEGATIVE
Unit division: 0

## 2013-09-15 LAB — GLUCOSE, CAPILLARY
Glucose-Capillary: 127 mg/dL — ABNORMAL HIGH (ref 70–99)
Glucose-Capillary: 207 mg/dL — ABNORMAL HIGH (ref 70–99)
Glucose-Capillary: 121 mg/dL — ABNORMAL HIGH (ref 70–99)

## 2013-09-15 MED ORDER — DOCUSATE SODIUM 100 MG PO CAPS: 100.0000 mg | ORAL_CAPSULE | Freq: Two times a day (BID) | ORAL | Status: DC

## 2013-09-15 MED ORDER — OXYCODONE-ACETAMINOPHEN 5-325 MG PO TABS: 2.0000 | ORAL_TABLET | ORAL | Status: DC | PRN

## 2013-09-15 NOTE — Progress Notes (Signed)
Inpatient Diabetes Program Recommendations  AACE/ADA: New Consensus Statement on Inpatient Glycemic Control (2013)  Target Ranges:  Prepandial:   less than 140 mg/dL      Peak postprandial:   less than 180 mg/dL (1-2 hours)      Critically ill patients:  140 - 180 mg/dL   Reason for Assessment:  Insulin Pump and Hypoglycemia  Insulin dep diabetic on pump admitted for repeat C/S at term for severe preeclampsia. Pt underwent Casearean delivery on 9/16. Was started on GlucoStabilizer on 9/15 and transitioned to insulin pump after delivery on 9/16.  Results for RENNEE, COYNE (MRN [id number]) as of 09/15/2013 11:03  Ref. Range 09/12/2013 12:54 09/12/2013 13:27 09/13/2013 06:20 09/13/2013 06:46 09/13/2013 08:20 09/13/2013 09:25 09/13/2013 12:42 09/13/2013 15:48 09/13/2013 17:11 09/13/2013 17:44  Glucose-Capillary Latest Range: 70-99 mg/dL 48 (L) 54 (L) 68 (L) 91 126 (H) 117 (H) 82 87 52 (L) 116 (H)   Pt is using own insulin pump and has transitioned back to pre-pregnancy settings per RN. Hypoglycemia Protocol documented.  For probable discharge in am. Instructed RN to call for any concerns/problems regarding insulin pump.  Thank you. Ailene Ards, RD, LDN, CDE Inpatient Diabetes Coordinator [phone]

## 2013-09-15 NOTE — Progress Notes (Signed)
Hypoglycemic Event  CBG:  52  Treatment: 15 GM carbohydrate snack  Symptoms: Sweaty  Follow-up CBG: Time: 2228 CBG Result: 93  Possible Reasons for Event: Medication regimen: Patient with own insulin pump  Comments/MD notified:    Andrea Hayden  Remember to initiate Hypoglycemia Order Set & complete

## 2013-09-15 NOTE — Progress Notes (Signed)
09/15/13 1400  Clinical Encounter Type  Visited With Patient  Visit Type Initial;Spiritual support;Social support  Spiritual Encounters  Spiritual Needs Emotional;Prayer   Andrea Hayden was in good spirits as I introduced Spiritual Care services.  She has cultivated an attitude that "things will happen in God's time," which helps her cope with change, ambiguity, and unforeseen issues.  She used our visit to share and process her story of Caleb's arrival and NICU stay, as well as to honor and reflect on her previous pregnancy losses.  Provided pastoral listening, reflection, and prayer.  [address] [place], [place] [phone]

## 2013-09-15 NOTE — Lactation Note (Signed)
This note was copied from the chart of Andrea Hayden. Lactation Consultation Note   Follow up consult with this mom and baby, in NICU. Dad present also. I assisted mom with latching baby. He latched but few suckles, and then none, despite stimulation. I then fitted mom for a 24 nipple shield, and showed mom how to apply. I use a curved tip syringe to place some EBM in the shield, and he suckled well . I then added an SNS under the shield, and he took an additional 10 mls, and then the remainder of his feed by bottle. We discussed breast feeding a late pre term baby, and triple feeding. I advised mom to call for an outpatient lactation consult in a week to two.  Mom's milk is beginning to transition in, expressing 25 mls at a time now. Mom knows to call lactation for any questions/concerns.  Patient Name: Andrea Hayden [id number] Date: 09/15/2013 Reason for consult: Follow-up assessment;NICU baby   Maternal Data    Feeding Feeding Type: Breast Milk Length of feed: 30 min  LATCH Score/Interventions Latch: Repeated attempts needed to sustain latch, nipple held in mouth throughout feeding, stimulation needed to elicit sucking reflex. (latched without shield, suckled only with nipple shield) Intervention(s): Adjust position;Assist with latch  Audible Swallowing: A few with stimulation Intervention(s): Skin to skin;Hand expression  Type of Nipple: Everted at rest and after stimulation  Comfort (Breast/Nipple): Soft / non-tender     Hold (Positioning): Assistance needed to correctly position infant at breast and maintain latch. Intervention(s): Breastfeeding basics reviewed;Support Pillows;Position options;Skin to skin  LATCH Score: 7  Lactation Tools Discussed/Used Tools: Nipple Shields Nipple shield size: 24   Consult Status Consult Status: PRN Follow-up type: Out-patient    Alfred Levins 09/15/2013, 4:31 PM

## 2013-10-04 ENCOUNTER — Encounter (HOSPITAL_COMMUNITY): Admission: RE | Payer: Self-pay | Source: Ambulatory Visit

## 2013-10-04 ENCOUNTER — Inpatient Hospital Stay (HOSPITAL_COMMUNITY)
Admission: RE | Admit: 2013-10-04 | Payer: BC Managed Care – PPO | Source: Ambulatory Visit | Admitting: Obstetrics & Gynecology

## 2013-10-04 SURGERY — Surgical Case
Anesthesia: Regional

## 2014-03-05 DIAGNOSIS — R609 Edema, unspecified: Secondary | ICD-10-CM | POA: Insufficient documentation

## 2014-08-22 ENCOUNTER — Ambulatory Visit: Payer: Self-pay | Admitting: Family Medicine

## 2014-10-29 ENCOUNTER — Encounter (HOSPITAL_COMMUNITY): Payer: Self-pay | Admitting: Obstetrics and Gynecology

## 2014-11-09 DIAGNOSIS — IMO0002 Reserved for concepts with insufficient information to code with codable children: Secondary | ICD-10-CM | POA: Insufficient documentation

## 2014-11-09 DIAGNOSIS — E669 Obesity, unspecified: Secondary | ICD-10-CM | POA: Insufficient documentation

## 2015-04-14 HISTORY — PX: OTHER SURGICAL HISTORY: SHX169

## 2016-02-22 ENCOUNTER — Other Ambulatory Visit: Payer: Self-pay | Admitting: Family Medicine

## 2016-02-22 ENCOUNTER — Encounter: Payer: Self-pay | Admitting: Family Medicine

## 2016-02-22 ENCOUNTER — Ambulatory Visit (INDEPENDENT_AMBULATORY_CARE_PROVIDER_SITE_OTHER): Payer: Managed Care, Other (non HMO) | Admitting: Family Medicine

## 2016-02-22 VITALS — BP 152/90 | HR 74 | Temp 98.7°F | Resp 16 | Wt 219.6 lb

## 2016-02-22 DIAGNOSIS — M545 Low back pain, unspecified: Secondary | ICD-10-CM

## 2016-02-22 DIAGNOSIS — J069 Acute upper respiratory infection, unspecified: Secondary | ICD-10-CM | POA: Diagnosis not present

## 2016-02-22 DIAGNOSIS — N3090 Cystitis, unspecified without hematuria: Secondary | ICD-10-CM | POA: Diagnosis not present

## 2016-02-22 LAB — POCT URINALYSIS DIPSTICK
Bilirubin, UA: NEGATIVE
GLUCOSE UA: NEGATIVE
Ketones, UA: NEGATIVE
Leukocytes, UA: NEGATIVE
Nitrite, UA: NEGATIVE
SPEC GRAV UA: 1.01
Urobilinogen, UA: 0.2
pH, UA: 6

## 2016-02-22 MED ORDER — FLUCONAZOLE 150 MG PO TABS: 150.0000 mg | ORAL_TABLET | Freq: Once | ORAL | Status: DC

## 2016-02-22 MED ORDER — NITROFURANTOIN MONOHYD MACRO 100 MG PO CAPS: 100.0000 mg | ORAL_CAPSULE | Freq: Two times a day (BID) | ORAL | Status: DC

## 2016-02-22 NOTE — Progress Notes (Signed)
Subjective:     Patient ID: Andrea Hayden, female   DOB: 1971/08/07, 45 y.o.   MRN: [id number]  HPI  Chief Complaint  Patient presents with  . Facial Pain    Patient comes in office today with concerns of sinus pressure and pain that began Wednesday 2/22 she has also reported that she has had congestion. Patient has tried taking otc Advil Allergy and severe congestion  . Back Pain    Patient complains of lower bacl pain for the past 2 days, patient states that she has had vaginal itching and pelvic pain associated  States she is developing PND with primarily clear drainage and early cough. Reports urinary frequency.   Review of Systems  Constitutional: Negative for fever and chills.  Skin: Negative for rash.       Objective:   Physical Exam  Constitutional: She appears well-developed and well-nourished. No distress.  Ears: T.M's intact without inflammation Sinuses: non-tender Throat: no tonsillar enlargement or exudate; mild  Posterior pharyngeal erythema Neck: no cervical adenopathy Lungs: clear     Assessment:    1. Bilateral low back pain without sciatica - Urine culture - POCT urinalysis dipstick  2. Cystitis - nitrofurantoin, macrocrystal-monohydrate, (MACROBID) 100 MG capsule; Take 1 capsule (100 mg total) by mouth 2 (two) times daily.  Dispense: 14 capsule; Refill: 0  3. Upper respiratory infection     Plan:    discussed otc medication.

## 2016-02-22 NOTE — Patient Instructions (Addendum)
Discussed use of Mucinex D for congestion, Delsym for cough, and Benadryl for postnasal drainage. We will call you with the urine culture results.

## 2016-02-25 LAB — URINE CULTURE: ORGANISM ID, BACTERIA: NO GROWTH

## 2016-02-27 ENCOUNTER — Telehealth: Payer: Self-pay

## 2016-02-27 NOTE — Telephone Encounter (Signed)
Patient was advised and states that she is feeling better

## 2016-02-27 NOTE — Telephone Encounter (Signed)
-----   Message from Anola Gurney, [place] sent at 02/27/2016  7:41 AM EST ----- No organism detected on urine culture. Are you better?

## 2016-06-17 DIAGNOSIS — G479 Sleep disorder, unspecified: Secondary | ICD-10-CM | POA: Insufficient documentation

## 2016-06-17 DIAGNOSIS — M25559 Pain in unspecified hip: Secondary | ICD-10-CM | POA: Insufficient documentation

## 2016-06-17 DIAGNOSIS — H6980 Other specified disorders of Eustachian tube, unspecified ear: Secondary | ICD-10-CM | POA: Insufficient documentation

## 2016-06-17 DIAGNOSIS — H6990 Unspecified Eustachian tube disorder, unspecified ear: Secondary | ICD-10-CM | POA: Insufficient documentation

## 2016-06-17 DIAGNOSIS — R9389 Abnormal findings on diagnostic imaging of other specified body structures: Secondary | ICD-10-CM | POA: Insufficient documentation

## 2016-06-17 DIAGNOSIS — I10 Essential (primary) hypertension: Secondary | ICD-10-CM | POA: Insufficient documentation

## 2016-06-18 ENCOUNTER — Other Ambulatory Visit: Payer: Self-pay | Admitting: Physician Assistant

## 2016-06-18 ENCOUNTER — Ambulatory Visit
Admission: RE | Admit: 2016-06-18 | Discharge: 2016-06-18 | Disposition: A | Discharge status: Home / Self Care | Payer: Managed Care, Other (non HMO) | Source: Ambulatory Visit | Attending: Physician Assistant | Admitting: Physician Assistant

## 2016-06-18 ENCOUNTER — Encounter: Payer: Self-pay | Admitting: Physician Assistant

## 2016-06-18 ENCOUNTER — Ambulatory Visit (INDEPENDENT_AMBULATORY_CARE_PROVIDER_SITE_OTHER): Payer: Managed Care, Other (non HMO) | Admitting: Physician Assistant

## 2016-06-18 VITALS — BP 130/80 | HR 61 | Temp 98.5°F | Resp 16 | Ht 64.0 in | Wt 218.6 lb

## 2016-06-18 DIAGNOSIS — M1611 Unilateral primary osteoarthritis, right hip: Secondary | ICD-10-CM | POA: Diagnosis not present

## 2016-06-18 DIAGNOSIS — M5412 Radiculopathy, cervical region: Secondary | ICD-10-CM | POA: Diagnosis present

## 2016-06-18 DIAGNOSIS — E782 Mixed hyperlipidemia: Secondary | ICD-10-CM | POA: Diagnosis not present

## 2016-06-18 DIAGNOSIS — Z0000 Encounter for general adult medical examination without abnormal findings: Secondary | ICD-10-CM | POA: Diagnosis not present

## 2016-06-18 NOTE — Progress Notes (Signed)
Patient: Andrea Hayden, Female    DOB: 12-15-1971, 45 y.o.   MRN: [id number] Visit Date: 06/18/2016  Today's Provider: Margaretann LovelessJennifer M Amarian Botero, PA-C   Chief Complaint  Patient presents with  . Annual Exam   Subjective:    Annual physical exam Andrea Hayden is a 45 y.o. female who presents today for health maintenance and complete physical. She feels fairly well. She reports exercising none. She reports she is sleeping well. She gets her mammogram and pap done by her Gyn. She goes to [facility] Darrel Hoover Kaplan at [facility]. She states her mammogram was in Dec 2016 and pap was earlier this year. [facility] also [facility] for her T1DM. She sees the nutritionist every 3 months and endocrinologist every 6 months. She also has her diabetic eye exam done at [facility], Dr. Druscilla BrowniePorfilio, most recently in January 2017.  She does have complaint today of right hip pain that is worsening. She has had this now for a few years and it is starting to progress and radiate to coccyx and down right leg. She has been seen by orthopedics, Dr. Reita Chardhris Smith and was told she has OA of R hip. She used to play soccer when she was younger. They offered her only a cortisone injection which she refused due to her T1DM.   She is also complaining of a new cervical spine pain with radiculopathy down the left arm all the way into the 4th finger on the hand. Initially was tingling now is pain. Wakes her from her sleep. No weakness noted.  -----------------------------------------------------------------   Review of Systems  Constitutional: Negative.   HENT: Positive for ear pain.   Eyes: Negative.   Respiratory: Negative.   Cardiovascular: Positive for leg swelling.  Gastrointestinal: Negative.   Endocrine: Negative.   Genitourinary: Positive for flank pain.  Musculoskeletal: Positive for back pain, joint swelling, arthralgias, neck pain and neck stiffness.  Skin: Negative.     Allergic/Immunologic: Negative.   Neurological: Positive for numbness (related to back and neck pain).  Hematological: Negative.   Psychiatric/Behavioral: Negative.     Social History      She  reports that she has never smoked. She does not have any smokeless tobacco history on file. She reports that she does not drink alcohol or use illicit drugs.       Social History   Social History  . Marital Status: Married    Spouse Name: N/A  . Number of Children: N/A  . Years of Education: N/A   Social History Main Topics  . Smoking status: Never Smoker   . Smokeless tobacco: None  . Alcohol Use: No  . Drug Use: No  . Sexual Activity: Yes    Birth Control/ Protection: None   Other Topics Concern  . None   Social History Narrative    Past Medical History  Diagnosis Date  . Diabetes mellitus without complication (HCC)     Type 1: external insulin pump  . 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 Anxiety 09/14/2007    Osteoarthritis  . GERD (gastroesophageal reflux disease)   . Allergy   . 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 Depression   . Hypertension   . Sleep disorder   . Abnormal CT scan     Abdominal     Patient Active Problem List   Diagnosis Date Noted  . Abnormal CAT  scan 06/17/2016  . Dysfunction of eustachian tube 06/17/2016  . Arthralgia of hip 06/17/2016  . BP (high blood pressure) 06/17/2016  . Disordered sleep 06/17/2016  . Adult BMI 30+ 11/09/2014  . Accumulation of fluid in tissues 03/05/2014  . Hypercholesterolemia 08/05/2013  . 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 Arthritis, degenerative 09/14/2007  . Esophagitis, reflux 07/07/2006  . Clinical depression 11/12/2005  . Type 1 diabetes mellitus (HCC) 11/12/2005  . 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 Cholecystectomy  2010  . Cesarean section    . Dilation and curettage of uterus    . Cesarean section with bilateral tubal ligation N/A 09/11/2013    Procedure: Repeat CESAREAN SECTION of baby boy at 1945 APGAR  WITH BILATERAL TUBAL LIGATION;  Surgeon: Freddrick MarchKendra H. Tenny Crawoss, MD;  Location: [facility];  Service: Obstetrics;  Laterality: N/A;  . Tubal ligation      Family History        Family Status  Relation Status Death Age  . Mother Alive   . Father Alive     Heart Ablation  . Paternal Grandmother Alive   . Paternal Grandfather Alive     Scoliosis,Bladder infections  . Daughter Alive   . Maternal Grandmother Deceased   . Maternal Grandfather Deceased     Dementia        Her family history includes Cancer in her paternal grandfather and paternal grandmother; Diabetes in her paternal grandmother; Hyperlipidemia in her mother and paternal grandmother; Hypertension in her father, mother, paternal grandfather, and paternal grandmother.    No Known Allergies  Current Meds  Medication Sig  . BAYER CONTOUR NEXT TEST test strip Reported on 06/18/2016  . hydrochlorothiazide (HYDRODIURIL) 12.5 MG tablet   . Insulin Human (INSULIN PUMP) 100 unit/ml SOLN Inject 1 each into the skin See admin instructions. Insulin pump, Novolog insulin  . NOVOLOG 100 UNIT/ML injection   . omega-3 fish oil (MAXEPA) 1000 MG CAPS capsule Take by mouth.  . ramipril (ALTACE) 2.5 MG capsule   . rosuvastatin (CRESTOR) 10 MG tablet Take by mouth.    Patient Care Team: Lorie PhenixNancy Maloney, MD as PCP - General (Family Medicine)     Objective:   Vitals: BP 130/80 mmHg  Pulse 61  Temp(Src) 98.5 F (36.9 C) (Oral)  Resp 16  Ht 5\' 4"  (1.626 m)  Wt 218 lb 9.6 oz (99.156 kg)  BMI 37.50 kg/m2   Physical Exam  Constitutional: She is oriented to person, place, and time. She appears well-developed and well-nourished. No distress.  HENT:  Head: Normocephalic and atraumatic.  Right Ear: External ear normal.    Left Ear: External ear normal.  Nose: Nose normal.  Mouth/Throat: Oropharynx is clear and moist. No oropharyngeal exudate.  Eyes: Conjunctivae and EOM are normal. Pupils are equal, round, and reactive to light. Right eye exhibits no discharge. Left eye exhibits no discharge. No scleral icterus.  Neck: Normal range of motion. Neck supple. No JVD present. Muscular tenderness present. No spinous process tenderness present. No rigidity. No tracheal deviation, no edema and normal range of motion present. No thyromegaly present.  Cardiovascular: Normal rate, regular rhythm, normal heart sounds and intact distal pulses.  Exam reveals no gallop and no friction rub.   No murmur heard. Pulmonary/Chest: Effort normal and breath sounds normal. No respiratory distress. She has no wheezes. She has no rales. She exhibits  no tenderness.  Abdominal: Soft. Bowel sounds are normal. She exhibits no distension and no mass. There is no tenderness. There is no rebound and no guarding.  Genitourinary:  Deferred to Ob/Gyn  Musculoskeletal: She exhibits no edema.       Right shoulder: Normal.       Left shoulder: Normal. She exhibits normal pulse and normal strength.       Right hip: She exhibits decreased range of motion (ER) and tenderness (deep in right groin). She exhibits normal strength, no swelling, no crepitus and no deformity.       Left hip: Normal.  Lymphadenopathy:    She has no cervical adenopathy.  Neurological: She is alert and oriented to person, place, and time.  Skin: Skin is warm and dry. No rash noted. She is not diaphoretic.  Psychiatric: She has a normal mood and affect. Her behavior is normal. Judgment and thought content normal.  Vitals reviewed.    Depression Screen No flowsheet data found.    Assessment & Plan:     Routine Health Maintenance and Physical Exam  Exercise Activities and Dietary recommendations Goals    None      Immunization History  Administered Date(s)  Administered  . Influenza,inj,Quad PF,36+ Mos 09/13/2013  . Tdap 11/23/2005, 09/12/2013    Health Maintenance  Topic Date Due  . HEMOGLOBIN A1C  08/06/72  . PNEUMOCOCCAL POLYSACCHARIDE VACCINE (1) 09/22/1974  . FOOT EXAM  09/22/1982  . OPHTHALMOLOGY EXAM  09/22/1982  . HIV Screening  09/23/1987  . PAP SMEAR  04/24/2016  . INFLUENZA VACCINE  07/28/2016  . TETANUS/TDAP  09/13/2023      Discussed health benefits of physical activity, and encouraged her to engage in regular exercise appropriate for her age and condition.   1. Annual physical exam Normal physical exam today. Will check labs as below and f/u pending lab results. If labs are stable and WNL she will not need to have these rechecked for one year at her next annual physical exam. She is to call the office in the meantime if she has any acute issue, questions or concerns. - CBC with Differential/Platelet - Comprehensive metabolic panel - TSH  2. Primary osteoarthritis of right hip Long history with right hip pain and known OA. Will recheck xray to see how much progressed and will refer her to [facility] orthopedics (has been seen by Dr. Katrinka Blazing, but did not feel they offered much; they only offered cortisone injection which she did not want due to her T1DM). Discussed PT but she wanted to see what ortho would say and see if they want to direct PT in any way, especially because she has two issues going on now with the new neck pain. I will f/u with her pending the imaging and go forth with referral. She is to call if symptoms worsen. - DG HIP UNILAT WITH PELVIS 2-3 VIEWS RIGHT; Future - Ambulatory referral to Orthopedic Surgery  3. Cervical radicular pain New onset over last 2 months that is worsening and now awakening her from sleep. H/O disc bulge C5. Underwent PT successfully many years ago. Now with new pain, different from before. Will obtain xray of cervical spine and refer to orthopedics for further evaluation. She is willing  to consider PT again but also wanted to see ortho. She is to call if symptoms worsen. - DG Cervical Spine Complete; Future - Ambulatory referral to Orthopedic Surgery  4. Combined fat and carbohydrate induced hyperlipemia Will check labs as below  and f/u pending results. She is not taking the crestor . She is also not taking her BP medications. - Lipid panel  --------------------------------------------------------------------    Margaretann Loveless, PA-C  [facility]

## 2016-06-18 NOTE — Patient Instructions (Signed)
Health Maintenance, Female Adopting a healthy lifestyle and getting preventive care can go a long way to promote health and wellness. Talk with your health care provider about what schedule of regular examinations is right for you. This is a good chance for you to check in with your provider about disease prevention and staying healthy. In between checkups, there are plenty of things you can do on your own. Experts have done a lot of research about which lifestyle changes and preventive measures are most likely to keep you healthy. Ask your health care provider for more information. WEIGHT AND DIET  Eat a healthy diet  Be sure to include plenty of vegetables, fruits, low-fat dairy products, and lean protein.  Do not eat a lot of foods high in solid fats, added sugars, or salt.  Get regular exercise. This is one of the most important things you can do for your health.  Most adults should exercise for at least 150 minutes each week. The exercise should increase your heart rate and make you sweat (moderate-intensity exercise).  Most adults should also do strengthening exercises at least twice a week. This is in addition to the moderate-intensity exercise.  Maintain a healthy weight  Body mass index (BMI) is a measurement that can be used to identify possible weight problems. It estimates body fat based on height and weight. Your health care provider can help determine your BMI and help you achieve or maintain a healthy weight.  For females 20 years of age and older:   A BMI below 18.5 is considered underweight.  A BMI of 18.5 to 24.9 is normal.  A BMI of 25 to 29.9 is considered overweight.  A BMI of 30 and above is considered obese.  Watch levels of cholesterol and blood lipids  You should start having your blood tested for lipids and cholesterol at 44 years of age, then have this test every 5 years.  You may need to have your cholesterol levels checked more often if:  Your lipid  or cholesterol levels are high.  You are older than 44 years of age.  You are at high risk for heart disease.  CANCER SCREENING   Lung Cancer  Lung cancer screening is recommended for adults 55-80 years old who are at high risk for lung cancer because of a history of smoking.  A yearly low-dose CT scan of the lungs is recommended for people who:  Currently smoke.  Have quit within the past 15 years.  Have at least a 30-pack-year history of smoking. A pack year is smoking an average of one pack of cigarettes a day for 1 year.  Yearly screening should continue until it has been 15 years since you quit.  Yearly screening should stop if you develop a health problem that would prevent you from having lung cancer treatment.  Breast Cancer  Practice breast self-awareness. This means understanding how your breasts normally appear and feel.  It also means doing regular breast self-exams. Let your health care provider know about any changes, no matter how small.  If you are in your 20s or 30s, you should have a clinical breast exam (CBE) by a health care provider every 1-3 years as part of a regular health exam.  If you are 40 or older, have a CBE every year. Also consider having a breast X-ray (mammogram) every year.  If you have a family history of breast cancer, talk to your health care provider about genetic screening.  If you   are at high risk for breast cancer, talk to your health care provider about having an MRI and a mammogram every year.  Breast cancer gene (BRCA) assessment is recommended for women who have family members with BRCA-related cancers. BRCA-related cancers include:  Breast.  Ovarian.  Tubal.  Peritoneal cancers.  Results of the assessment will determine the need for genetic counseling and BRCA1 and BRCA2 testing. Cervical Cancer Your health care provider may recommend that you be screened regularly for cancer of the pelvic organs (ovaries, uterus, and  vagina). This screening involves a pelvic examination, including checking for microscopic changes to the surface of your cervix (Pap test). You may be encouraged to have this screening done every 3 years, beginning at age 21.  For women ages 30-65, health care providers may recommend pelvic exams and Pap testing every 3 years, or they may recommend the Pap and pelvic exam, combined with testing for human papilloma virus (HPV), every 5 years. Some types of HPV increase your risk of cervical cancer. Testing for HPV may also be done on women of any age with unclear Pap test results.  Other health care providers may not recommend any screening for nonpregnant women who are considered low risk for pelvic cancer and who do not have symptoms. Ask your health care provider if a screening pelvic exam is right for you.  If you have had past treatment for cervical cancer or a condition that could lead to cancer, you need Pap tests and screening for cancer for at least 20 years after your treatment. If Pap tests have been discontinued, your risk factors (such as having a new sexual partner) need to be reassessed to determine if screening should resume. Some women have medical problems that increase the chance of getting cervical cancer. In these cases, your health care provider may recommend more frequent screening and Pap tests. Colorectal Cancer  This type of cancer can be detected and often prevented.  Routine colorectal cancer screening usually begins at 44 years of age and continues through 44 years of age.  Your health care provider may recommend screening at an earlier age if you have risk factors for colon cancer.  Your health care provider may also recommend using home test kits to check for hidden blood in the stool.  A small camera at the end of a tube can be used to examine your colon directly (sigmoidoscopy or colonoscopy). This is done to check for the earliest forms of colorectal  cancer.  Routine screening usually begins at age 50.  Direct examination of the colon should be repeated every 5-10 years through 44 years of age. However, you may need to be screened more often if early forms of precancerous polyps or small growths are found. Skin Cancer  Check your skin from head to toe regularly.  Tell your health care provider about any new moles or changes in moles, especially if there is a change in a mole's shape or color.  Also tell your health care provider if you have a mole that is larger than the size of a pencil eraser.  Always use sunscreen. Apply sunscreen liberally and repeatedly throughout the day.  Protect yourself by wearing long sleeves, pants, a wide-brimmed hat, and sunglasses whenever you are outside. HEART DISEASE, DIABETES, AND HIGH BLOOD PRESSURE   High blood pressure causes heart disease and increases the risk of stroke. High blood pressure is more likely to develop in:  People who have blood pressure in the high end   of the normal range (130-139/85-89 mm Hg).  People who are overweight or obese.  People who are African American.  If you are 38-23 years of age, have your blood pressure checked every 3-5 years. If you are 61 years of age or older, have your blood pressure checked every year. You should have your blood pressure measured twice--once when you are at a hospital or clinic, and once when you are not at a hospital or clinic. Record the average of the two measurements. To check your blood pressure when you are not at a hospital or clinic, you can use:  An automated blood pressure machine at a pharmacy.  A home blood pressure monitor.  If you are between 45 years and 39 years old, ask your health care provider if you should take aspirin to prevent strokes.  Have regular diabetes screenings. This involves taking a blood sample to check your fasting blood sugar level.  If you are at a normal weight and have a low risk for diabetes,  have this test once every three years after 44 years of age.  If you are overweight and have a high risk for diabetes, consider being tested at a younger age or more often. PREVENTING INFECTION  Hepatitis B  If you have a higher risk for hepatitis B, you should be screened for this virus. You are considered at high risk for hepatitis B if:  You were born in a country where hepatitis B is common. Ask your health care provider which countries are considered high risk.  Your parents were born in a high-risk country, and you have not been immunized against hepatitis B (hepatitis B vaccine).  You have HIV or AIDS.  You use needles to inject street drugs.  You live with someone who has hepatitis B.  You have had sex with someone who has hepatitis B.  You get hemodialysis treatment.  You take certain medicines for conditions, including cancer, organ transplantation, and autoimmune conditions. Hepatitis C  Blood testing is recommended for:  Everyone born from [birth year] through 1964.  Anyone with known risk factors for hepatitis C. Sexually transmitted infections (STIs)  You should be screened for sexually transmitted infections (STIs) including gonorrhea and chlamydia if:  You are sexually active and are younger than 44 years of age.  You are older than 44 years of age and your health care provider tells you that you are at risk for this type of infection.  Your sexual activity has changed since you were last screened and you are at an increased risk for chlamydia or gonorrhea. Ask your health care provider if you are at risk.  If you do not have HIV, but are at risk, it may be recommended that you take a prescription medicine daily to prevent HIV infection. This is called pre-exposure prophylaxis (PrEP). You are considered at risk if:  You are sexually active and do not regularly use condoms or know the HIV status of your partner(s).  You take drugs by injection.  You are sexually  active with a partner who has HIV. Talk with your health care provider about whether you are at high risk of being infected with HIV. If you choose to begin PrEP, you should first be tested for HIV. You should then be tested every 3 months for as long as you are taking PrEP.  PREGNANCY   If you are premenopausal and you may become pregnant, ask your health care provider about preconception counseling.  If you may  become pregnant, take 400 to 800 micrograms (mcg) of folic acid every day.  If you want to prevent pregnancy, talk to your health care provider about birth control (contraception). OSTEOPOROSIS AND MENOPAUSE   Osteoporosis is a disease in which the bones lose minerals and strength with aging. This can result in serious bone fractures. Your risk for osteoporosis can be identified using a bone density scan.  If you are 61 years of age or older, or if you are at risk for osteoporosis and fractures, ask your health care provider if you should be screened.  Ask your health care provider whether you should take a calcium or vitamin D supplement to lower your risk for osteoporosis.  Menopause may have certain physical symptoms and risks.  Hormone replacement therapy may reduce some of these symptoms and risks. Talk to your health care provider about whether hormone replacement therapy is right for you.  HOME CARE INSTRUCTIONS   Schedule regular health, dental, and eye exams.  Stay current with your immunizations.   Do not use any tobacco products including cigarettes, chewing tobacco, or electronic cigarettes.  If you are pregnant, do not drink alcohol.  If you are breastfeeding, limit how much and how often you drink alcohol.  Limit alcohol intake to no more than 1 drink per day for nonpregnant women. One drink equals 12 ounces of beer, 5 ounces of wine, or 1 ounces of hard liquor.  Do not use street drugs.  Do not share needles.  Ask your health care provider for help if  you need support or information about quitting drugs.  Tell your health care provider if you often feel depressed.  Tell your health care provider if you have ever been abused or do not feel safe at home.   This information is not intended to replace advice given to you by your health care provider. Make sure you discuss any questions you have with your health care provider.   Document Released: 06/29/2011 Document Revised: 01/04/2015 Document Reviewed: 11/15/2013 Elsevier Interactive Patient Education [employer].

## 2016-06-19 ENCOUNTER — Telehealth: Payer: Self-pay

## 2016-06-19 LAB — COMPREHENSIVE METABOLIC PANEL
ALK PHOS: 102 IU/L (ref 39–117)
ALT: 13 IU/L (ref 0–32)
AST: 20 IU/L (ref 0–40)
Albumin/Globulin Ratio: 1.4 (ref 1.2–2.2)
Albumin: 4.1 g/dL (ref 3.5–5.5)
BILIRUBIN TOTAL: 0.4 mg/dL (ref 0.0–1.2)
BUN / CREAT RATIO: 12 (ref 9–23)
BUN: 8 mg/dL (ref 6–24)
CO2: 23 mmol/L (ref 18–29)
Calcium: 9.1 mg/dL (ref 8.7–10.2)
Chloride: 93 mmol/L — ABNORMAL LOW (ref 96–106)
Creatinine, Ser: 0.65 mg/dL (ref 0.57–1.00)
GFR calc Af Amer: 126 mL/min/{1.73_m2} (ref >59)
GFR calc non Af Amer: 109 mL/min/{1.73_m2} (ref >59)
GLOBULIN, TOTAL: 3 g/dL (ref 1.5–4.5)
Glucose: 147 mg/dL — ABNORMAL HIGH (ref 65–99)
Potassium: 3.8 mmol/L (ref 3.5–5.2)
SODIUM: 137 mmol/L (ref 134–144)
Total Protein: 7.1 g/dL (ref 6.0–8.5)

## 2016-06-19 LAB — CBC WITH DIFFERENTIAL/PLATELET
BASOS ABS: 0 10*3/uL (ref 0.0–0.2)
Basos: 0 %
EOS (ABSOLUTE): 0.1 10*3/uL (ref 0.0–0.4)
EOS: 1 %
HEMOGLOBIN: 12.2 g/dL (ref 11.1–15.9)
Hematocrit: 35.5 % (ref 34.0–46.6)
IMMATURE GRANS (ABS): 0 10*3/uL (ref 0.0–0.1)
IMMATURE GRANULOCYTES: 0 %
LYMPHS ABS: 1.4 10*3/uL (ref 0.7–3.1)
LYMPHS: 23 %
MCH: 28.3 pg (ref 26.6–33.0)
MCHC: 34.4 g/dL (ref 31.5–35.7)
MCV: 82 fL (ref 79–97)
Monocytes Absolute: 0.5 10*3/uL (ref 0.1–0.9)
Monocytes: 8 %
NEUTROS PCT: 68 %
Neutrophils Absolute: 4 10*3/uL (ref 1.4–7.0)
Platelets: 266 10*3/uL (ref 150–379)
RBC: 4.31 x10E6/uL (ref 3.77–5.28)
RDW: 13.3 % (ref 12.3–15.4)
WBC: 6.1 10*3/uL (ref 3.4–10.8)

## 2016-06-19 LAB — LIPID PANEL
CHOL/HDL RATIO: 3.8 ratio (ref 0.0–4.4)
CHOLESTEROL TOTAL: 235 mg/dL — AB (ref 100–199)
HDL: 62 mg/dL (ref >39)
LDL Calculated: 151 mg/dL — ABNORMAL HIGH (ref 0–99)
TRIGLYCERIDES: 108 mg/dL (ref 0–149)
VLDL CHOLESTEROL CAL: 22 mg/dL (ref 5–40)

## 2016-06-19 LAB — SPECIMEN STATUS REPORT

## 2016-06-19 LAB — TSH: TSH: 3.18 u[IU]/mL (ref 0.450–4.500)

## 2016-06-19 NOTE — Telephone Encounter (Signed)
Patient advised as directed below.  Thanks,  -Reznor Ferrando 

## 2016-06-19 NOTE — Telephone Encounter (Signed)
-----   Message from Margaretann LovelessJennifer M Burnette, [place]PA-C sent at 06/19/2016  3:26 PM EDT ----- All labs are within normal limits and stable with exception of cholesterol which is borderline high. HDL (good) cholesterol is elevated as well which offers cardioprotection and keeps you from needing cholesterol lowering medication at this time. Make sure to continue to work on lifestyle modifications including limiting fatty foods in diet and adding physical activity.  Metabolic panel has not yet resulted. When it does I will send separate message. Thanks! -JB

## 2016-06-19 NOTE — Telephone Encounter (Signed)
-----   Message from Margaretann LovelessJennifer M Burnette, [place]PA-C sent at 06/18/2016  2:47 PM EDT ----- DDD noted at C6-C7 and mild hip osteoarthritis noted on xrays. Will follow through with ortho and I do think eventual PT will be best option.

## 2016-06-19 NOTE — Telephone Encounter (Signed)
LMTCB  Thanks,  -Joseline 

## 2016-06-19 NOTE — Telephone Encounter (Signed)
-----   Message from Margaretann LovelessJennifer M Burnette, [place] sent at 06/19/2016  4:48 PM EDT ----- CMP was normal with exception of glucose being elevated at 147, otherwise unremarkable.

## 2016-06-22 NOTE — Telephone Encounter (Signed)
Pt returned call.  States if you call back in the next few hrs call her at home [phone] and if not the on cell.

## 2016-06-22 NOTE — Telephone Encounter (Signed)
Patient advised as directed below. Voiced understanding.  Thanks,  -Liah Morr 

## 2016-09-07 ENCOUNTER — Ambulatory Visit (INDEPENDENT_AMBULATORY_CARE_PROVIDER_SITE_OTHER): Payer: Managed Care, Other (non HMO) | Admitting: Family Medicine

## 2016-09-07 ENCOUNTER — Encounter: Payer: Self-pay | Admitting: Family Medicine

## 2016-09-07 VITALS — BP 124/80 | HR 58 | Temp 98.8°F | Resp 16 | Ht 64.0 in | Wt 218.0 lb

## 2016-09-07 DIAGNOSIS — R519 Headache, unspecified: Secondary | ICD-10-CM

## 2016-09-07 DIAGNOSIS — R51 Headache: Secondary | ICD-10-CM

## 2016-09-07 DIAGNOSIS — M5030 Other cervical disc degeneration, unspecified cervical region: Secondary | ICD-10-CM

## 2016-09-07 MED ORDER — AMITRIPTYLINE HCL 10 MG PO TABS: ORAL_TABLET | ORAL | 1 refills | Status: DC

## 2016-09-07 MED ORDER — BUTALBITAL-APAP-CAFFEINE 50-325-40 MG PO TABS: 1.0000 | ORAL_TABLET | Freq: Four times a day (QID) | ORAL | 0 refills | Status: DC | PRN

## 2016-09-07 NOTE — Progress Notes (Signed)
Patient: Andrea Hayden Female    DOB: [birth year]   45 y.o.   MRN: [id number] Visit Date: 09/07/2016  Today's Provider: Mila Merryonald Donyae Kilner, MD   Chief Complaint  Patient presents with  . Headache   Subjective:    Patient has had migraines and headaches in the last 4 to 6 months. Patient states that she gets a migraine about every 3 weeks. Also has symptoms of nausea and some lightheadedness during migraines. Usually takes otc tylenol with mild relief. Patient currently has migraines that has lasted for last 9 days.    Headache   This is a new problem. The current episode started 1 to 4 weeks ago (9 days ago). The problem occurs intermittently. The problem has been unchanged. The pain is located in the bilateral and temporal region. The pain radiates to the right neck and left neck. The pain quality is not similar to prior headaches. The quality of the pain is described as dull, pulsating, shooting and aching. The pain is at a severity of 6/10. The pain is moderate. Associated symptoms include dizziness, ear pain, eye pain, eye redness, facial sweating, nausea, neck pain, photophobia, scalp tenderness, sinus pressure and tinnitus. Pertinent negatives include no abdominal pain, abnormal behavior, anorexia, back pain, blurred vision, coughing, drainage, eye watering, fever, hearing loss, insomnia, loss of balance, muscle aches, numbness, phonophobia, rhinorrhea, seizures, sore throat, swollen glands, tingling, visual change, vomiting, weakness or weight loss. The symptoms are aggravated by noise. She has tried acetaminophen for the symptoms. The treatment provided mild relief.  she does report her mother had severe migraines at patient's age. She also reports long history for DDD of C-spine for which she sees Dr. Jonnie FinnerNaylor, but has not had any more trouble with this than usual  She had home sleep study 04/14/2015 and there were no desaturation eventw.     No Known Allergies   Current  Outpatient Prescriptions:  .  acetaminophen (TYLENOL) 325 MG tablet, Take 650 mg by mouth daily as needed for pain. Reported on 06/18/2016, Disp: , Rfl:  .  BAYER CONTOUR NEXT TEST test strip, Reported on 06/18/2016, Disp: , Rfl: 0 .  Insulin Human (INSULIN PUMP) 100 unit/ml SOLN, Inject 1 each into the skin See admin instructions. Insulin pump, Novolog insulin, Disp: , Rfl:  .  NOVOLOG 100 UNIT/ML injection, , Disp: , Rfl: 10 .  omega-3 fish oil (MAXEPA) 1000 MG CAPS capsule, Take by mouth., Disp: , Rfl:  .  ramipril (ALTACE) 2.5 MG capsule, Reported on 06/18/2016, Disp: , Rfl: 2 .  hydrochlorothiazide (HYDRODIURIL) 12.5 MG tablet, Reported on 06/18/2016, Disp: , Rfl: 2  Review of Systems  Constitutional: Negative for appetite change, chills, fatigue, fever and weight loss.  HENT: Positive for ear pain, sinus pressure and tinnitus. Negative for hearing loss, rhinorrhea and sore throat.   Eyes: Positive for photophobia, pain and redness. Negative for blurred vision.  Respiratory: Negative for cough, chest tightness and shortness of breath.   Cardiovascular: Negative for chest pain and palpitations.  Gastrointestinal: Positive for nausea. Negative for abdominal pain, anorexia and vomiting.  Musculoskeletal: Positive for neck pain. Negative for back pain.  Neurological: Positive for dizziness, light-headedness and headaches. Negative for tingling, seizures, weakness, numbness and loss of balance.  Psychiatric/Behavioral: The patient does not have insomnia.     Social History  Substance Use Topics  . Smoking status: Never Smoker  . Smokeless tobacco: Not on file  . Alcohol use No  Objective:   BP 124/80 (BP Location: Right Arm, Patient Position: Sitting, Cuff Size: Large)   Pulse (!) 58   Temp 98.8 F (37.1 C) (Oral)   Resp 16   Ht 5\' 4"  (1.626 m)   Wt 218 lb (98.9 kg)   BMI 37.42 kg/m   Physical Exam   General Appearance:    Alert, cooperative, no distress  Eyes:    PERRL,  conjunctiva/corneas clear, EOM's intact       Lungs:     Clear to auscultation bilaterally, respirations unlabored  Heart:    Regular rate and rhythm  Neurologic:   Awake, alert, oriented x 3. No apparent focal neurological           defect.   MS:     Mild tenderness of paracervical muscles, FROM of neck and spine, no gross deformities.        Assessment & Plan:     1. Chronic daily headache Not typical for migraine or cluster headaches. Likely tension headaches possibly exacerbated by DDD of c-spine. Start prn and prophylactic medications and follow up in about a month.  - butalbital-acetaminophen-caffeine (FIORICET) 50-325-40 MG tablet; Take 1-2 tablets by mouth every 6 (six) hours as needed for headache.  Dispense: 20 tablet; Refill: 0 - amitriptyline (ELAVIL) 10 MG tablet; Start 1 tablet at bedtime for prevention of headaches. Titrate up to 3 tablet at bedtime as needed  Dispense: 60 tablet; Refill: 1  2. DDD (degenerative disc disease), cervical        Mila Merry, MD  [facility]

## 2016-10-05 ENCOUNTER — Ambulatory Visit: Payer: Managed Care, Other (non HMO) | Admitting: Family Medicine

## 2016-10-15 ENCOUNTER — Encounter: Payer: Self-pay | Admitting: Family Medicine

## 2016-10-15 ENCOUNTER — Ambulatory Visit (INDEPENDENT_AMBULATORY_CARE_PROVIDER_SITE_OTHER): Payer: Managed Care, Other (non HMO) | Admitting: Family Medicine

## 2016-10-15 VITALS — BP 122/84 | HR 60 | Temp 97.7°F | Resp 16 | Wt 217.8 lb

## 2016-10-15 DIAGNOSIS — R1031 Right lower quadrant pain: Secondary | ICD-10-CM | POA: Diagnosis not present

## 2016-10-15 LAB — POCT URINALYSIS DIPSTICK
BILIRUBIN UA: NEGATIVE
Blood, UA: NEGATIVE
Glucose, UA: NEGATIVE
KETONES UA: NEGATIVE
Leukocytes, UA: NEGATIVE
Nitrite, UA: NEGATIVE
SPEC GRAV UA: 1.01
Urobilinogen, UA: 0.2
pH, UA: 7

## 2016-10-15 NOTE — Progress Notes (Signed)
Subjective:     Patient ID: Andrea Hayden, female   DOB: 04-14-1971, 45 y.o.   MRN: [id number]  HPI  Chief Complaint  Patient presents with  . Abdominal Pain    Patient comes in office today with complaints of RLQ pain for the past 12 hrs. Patient states that pain is a dull ache, associated with pain patient reports gas and belching.   States ache has abated but still having twinges. Has moved her bowels today per routine. No fever,nausea or vomiting. No hx of kidney stones; remote hx of ovarian cyst. Accompanied by her daughter today.   Review of Systems  Genitourinary: Negative for dysuria.       Objective:   Physical Exam  Constitutional: She appears well-developed and well-nourished. No distress.  Abdominal: Soft. Bowel sounds are normal. There is tenderness (mild in right lower quadrant). There is no guarding.       Assessment:    1. Right lower quadrant abdominal pain - POCT urinalysis dipstick - US Pelvis Complete; Future - US Transvaginal Non-OB; Future    Plan:    Further f/u pending ultrasound result. Discussed use of nsaid's.

## 2016-10-15 NOTE — Patient Instructions (Signed)
Take two Aleve twice daily with food. We will call you about the ultrasound schedule.

## 2016-10-22 ENCOUNTER — Ambulatory Visit
Admission: RE | Admit: 2016-10-22 | Discharge: 2016-10-22 | Disposition: A | Discharge status: Home / Self Care | Payer: Managed Care, Other (non HMO) | Source: Ambulatory Visit | Attending: Family Medicine | Admitting: Family Medicine

## 2016-10-22 DIAGNOSIS — R1031 Right lower quadrant pain: Secondary | ICD-10-CM | POA: Insufficient documentation

## 2016-10-22 DIAGNOSIS — N838 Other noninflammatory disorders of ovary, fallopian tube and broad ligament: Secondary | ICD-10-CM | POA: Diagnosis not present

## 2016-10-27 ENCOUNTER — Ambulatory Visit (INDEPENDENT_AMBULATORY_CARE_PROVIDER_SITE_OTHER): Payer: Managed Care, Other (non HMO)

## 2016-10-27 ENCOUNTER — Ambulatory Visit (INDEPENDENT_AMBULATORY_CARE_PROVIDER_SITE_OTHER): Payer: Managed Care, Other (non HMO) | Admitting: Podiatry

## 2016-10-27 ENCOUNTER — Encounter: Payer: Self-pay | Admitting: Podiatry

## 2016-10-27 VITALS — BP 155/92 | HR 65 | Temp 98.2°F | Resp 16 | Ht 64.0 in | Wt 217.0 lb

## 2016-10-27 DIAGNOSIS — R600 Localized edema: Secondary | ICD-10-CM

## 2016-10-27 DIAGNOSIS — M79671 Pain in right foot: Secondary | ICD-10-CM

## 2016-10-27 DIAGNOSIS — M84374A Stress fracture, right foot, initial encounter for fracture: Secondary | ICD-10-CM

## 2016-10-27 DIAGNOSIS — R52 Pain, unspecified: Secondary | ICD-10-CM

## 2016-10-27 DIAGNOSIS — M79673 Pain in unspecified foot: Secondary | ICD-10-CM

## 2016-10-27 MED ORDER — NONFORMULARY OR COMPOUNDED ITEM: 1.0000 g | Freq: Four times a day (QID) | 2 refills | Status: DC

## 2016-10-27 NOTE — Progress Notes (Signed)
   Subjective:    Patient ID: Andrea Hayden, female    DOB: [birth year], 45 y.o.   MRN: [id number]  HPI    Review of Systems  Cardiovascular: Positive for leg swelling.  All other systems reviewed and are negative.      Objective:   Physical Exam        Assessment & Plan:

## 2016-11-01 NOTE — Progress Notes (Signed)
Patient ID: Andrea Hayden, female   DOB: [birth year], 45 y.o.   MRN: [id number] Subjective:  Patient with a history of diabetes mellitus presents with right foot pain for about 3-4 weeks now. Patient stated is progressively been getting worse. Patient states that is very painful barefoot or certain types of shoes. Patient has been icing with Tylenol given some relief. Patient presents today for further treatment and evaluation.    Objective/Physical Exam General: The patient is alert and oriented x3 in no acute distress.  Dermatology: Skin is warm, dry and supple bilateral lower extremities. Negative for open lesions or macerations.  Vascular: Palpable pedal pulses bilaterally. No edema or erythema noted. Capillary refill within normal limits.  Neurological: Epicritic and protective threshold grossly intact bilaterally.   Musculoskeletal Exam: Pain on palpation noted to the lateral aspect of the fifth metatarsal right foot. Range of motion within normal limits to all pedal and ankle joints bilateral. Muscle strength 5/5 in all groups bilateral.   Radiographic Exam:  Nondisplaced, closed, hairline stress fracture to the fifth metatarsal right foot.  Assessment: #1 stress fracture fifth metatarsal right foot #2 Edema right foot #3 Pain in right foot   Plan of Care:  #1 Patient was evaluated. #2 short cam boot dispensed. Discussed the patient should be nonweightbearing or minimal weightbearing right lower extremity. #3 compression anklet dispensed #4 prescription for [facility]-inflammatory pain cream [facility] #5 patient is to return to clinic in 4 weeks   Dr. Felecia ShellingBrent M. Jasdeep Dejarnett, DPM [facility]

## 2016-11-24 ENCOUNTER — Ambulatory Visit (INDEPENDENT_AMBULATORY_CARE_PROVIDER_SITE_OTHER): Payer: Managed Care, Other (non HMO) | Admitting: Podiatry

## 2016-11-24 ENCOUNTER — Ambulatory Visit (INDEPENDENT_AMBULATORY_CARE_PROVIDER_SITE_OTHER): Payer: Managed Care, Other (non HMO)

## 2016-11-24 DIAGNOSIS — M79671 Pain in right foot: Secondary | ICD-10-CM | POA: Diagnosis not present

## 2016-11-24 DIAGNOSIS — M84374D Stress fracture, right foot, subsequent encounter for fracture with routine healing: Secondary | ICD-10-CM | POA: Diagnosis not present

## 2016-11-24 NOTE — Progress Notes (Signed)
Subjective:  Patient presents today for follow-up evaluation of a stress fracture to the fifth metatarsal right foot. Patient states she's doing much better. Patient has been wearing regular shoes on the house. Patient denies any pain or swelling.    Objective/Physical Exam General: The patient is alert and oriented x3 in no acute distress.  Dermatology: Skin is warm, dry and supple bilateral lower extremities. Negative for open lesions or macerations.  Vascular: Palpable pedal pulses bilaterally. No edema or erythema noted. Capillary refill within normal limits.  Neurological: Epicritic and protective threshold grossly intact bilaterally.   Musculoskeletal Exam: Range of motion within normal limits to all pedal and ankle joints bilateral. Muscle strength 5/5 in all groups bilateral. Negative for pain on palpation  Radiographic Exam:  X-rays today show that there is 100% healing of the stress fracture to the fifth metatarsal right foot. No visible fracture line noted.   Assessment: #1 stress fracture fifth metatarsal right foot-healed #2 negative for pain on palpation right foot   Plan of Care:  #1 Patient was evaluated. #2 today the patient will transition from the cam boot to good supportive sneakers over the next week next line #3 continue to wear compression anklet #4 [facility]  #5 return to clinic when necessary   Dr. Felecia ShellingBrent M. Karema Tocci, DPM [facility]

## 2017-02-08 LAB — HM DIABETES EYE EXAM

## 2017-02-10 LAB — HM DIABETES EYE EXAM

## 2017-02-11 ENCOUNTER — Encounter: Payer: Self-pay | Admitting: Physician Assistant

## 2017-04-09 ENCOUNTER — Other Ambulatory Visit (HOSPITAL_COMMUNITY): Payer: Self-pay | Admitting: Obstetrics

## 2017-04-09 DIAGNOSIS — O24012 Pre-existing diabetes mellitus, type 1, in pregnancy, second trimester: Secondary | ICD-10-CM

## 2017-04-09 DIAGNOSIS — Z3482 Encounter for supervision of other normal pregnancy, second trimester: Secondary | ICD-10-CM

## 2017-05-27 ENCOUNTER — Encounter (HOSPITAL_COMMUNITY): Payer: Self-pay | Admitting: *Deleted

## 2017-05-28 ENCOUNTER — Encounter (HOSPITAL_COMMUNITY): Payer: Self-pay

## 2017-05-28 ENCOUNTER — Ambulatory Visit (HOSPITAL_COMMUNITY)
Admission: RE | Admit: 2017-05-28 | Discharge: 2017-05-28 | Disposition: A | Discharge status: Home / Self Care | Payer: Managed Care, Other (non HMO) | Source: Ambulatory Visit | Attending: Obstetrics | Admitting: Obstetrics

## 2017-05-28 DIAGNOSIS — O09522 Supervision of elderly multigravida, second trimester: Secondary | ICD-10-CM | POA: Diagnosis not present

## 2017-05-28 DIAGNOSIS — E108 Type 1 diabetes mellitus with unspecified complications: Secondary | ICD-10-CM | POA: Diagnosis not present

## 2017-05-28 DIAGNOSIS — Z3A17 17 weeks gestation of pregnancy: Secondary | ICD-10-CM | POA: Diagnosis not present

## 2017-05-28 DIAGNOSIS — O24019 Pre-existing diabetes mellitus, type 1, in pregnancy, unspecified trimester: Secondary | ICD-10-CM | POA: Insufficient documentation

## 2017-05-28 DIAGNOSIS — O9989 Other specified diseases and conditions complicating pregnancy, childbirth and the puerperium: Secondary | ICD-10-CM

## 2017-05-28 DIAGNOSIS — Z3689 Encounter for other specified antenatal screening: Secondary | ICD-10-CM | POA: Insufficient documentation

## 2017-05-28 DIAGNOSIS — O24012 Pre-existing diabetes mellitus, type 1, in pregnancy, second trimester: Secondary | ICD-10-CM | POA: Diagnosis not present

## 2017-05-28 DIAGNOSIS — O10912 Unspecified pre-existing hypertension complicating pregnancy, second trimester: Secondary | ICD-10-CM | POA: Insufficient documentation

## 2017-05-28 DIAGNOSIS — O09292 Supervision of pregnancy with other poor reproductive or obstetric history, second trimester: Secondary | ICD-10-CM | POA: Insufficient documentation

## 2017-05-28 DIAGNOSIS — O10919 Unspecified pre-existing hypertension complicating pregnancy, unspecified trimester: Secondary | ICD-10-CM

## 2017-05-28 DIAGNOSIS — Z3482 Encounter for supervision of other normal pregnancy, second trimester: Secondary | ICD-10-CM

## 2017-05-28 DIAGNOSIS — O99891 Other specified diseases and conditions complicating pregnancy: Secondary | ICD-10-CM

## 2017-05-28 DIAGNOSIS — Z8659 Personal history of other mental and behavioral disorders: Secondary | ICD-10-CM | POA: Insufficient documentation

## 2017-05-28 NOTE — Progress Notes (Signed)
Maternal Fetal Medicine Consultation  Requesting Provider(s): [facility]  [facility] Reason for consultation: 1. Type 1 diabetes 2. Chronic hypertension 3. AMA 4. History of preeclampsia 5. History of postpartum depression x2  HPI: 45yo P1132 at 17+4 weeks with problems as noted above. Here for MFM consultation OB History: OB History    Gravida Para Term Preterm AB Living   6 2 1 1 3 2    SAB TAB Ectopic Multiple Live Births   3 0 0 0 2      PMH:  Past Medical History:  Diagnosis Date  . Abnormal CT scan    Abdominal  . Allergy   . Anxiety 09/14/2007   Osteoarthritis  . Depression   . Diabetes mellitus without complication (HCC)    Type 1: external insulin pump  . GERD (gastroesophageal reflux disease)   . 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 Hypertension   . 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 Sleep disorder   . 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 CESAREAN SECTION    . CESAREAN SECTION WITH BILATERAL TUBAL LIGATION N/A 09/11/2013   Procedure: Repeat CESAREAN SECTION of baby boy at 1945 APGAR  WITH BILATERAL TUBAL LIGATION;  Surgeon: Freddrick March. Tenny Craw, MD;  Location: [facility];  Service: Obstetrics;  Laterality: N/A;  . CHOLECYSTECTOMY  2010  . DILATION AND CURETTAGE OF UTERUS    . Sleep Study  04/14/2015   Home sleep study showing no desaturations.   . TUBAL LIGATION     Meds: Novalog via insulin pump with continuous glucose monitoring; Nifedipine XL 30mg /day; ASA 81mg /day; PNV Allergies: NKDA FH: See EPIC section on family history Soc: Denies tobacco, illicit drug use or alcohol use  Review of Systems: no vaginal bleeding or cramping/contractions, no LOF, no nausea/vomiting. All other systems reviewed and are negative.  PNL: All PNL are within normal parameters   PE:  See EPIC section on physical exam and vital signs  Please see separate document for fetal  ultrasound report.  A/P: 1. AMA: the patient has already undergone NIPT and has a low-risk result. She is not interested in further invasive testing. Because of her age she is candidate for antepartum testing starting at 36 weeks, but this is superceded by her need for antepartum testing for diabetes and hypertension 2. Chronic hypertension: she is on nifedipine with minimal side effects. Due to her long standing diabetes she is not an ideal candidate for beta blockers, even labetalol. I would recommend a 24 hour urine to assess for the presence of hypertensive (and diabetic) nephropathy. As above she needs antepartum testing and I would begin this at 30 weeks. Testing can take the form of twice weekly NSTs or weekly BPP. 3. History or preeclampsia: she is already on preventive ASA which was started very early and appropriately. The previously mentioned 24 hour urine should provide baseline values for proteinuria. Baseline LFTs should also be obtained. Otherwise close observation, as already planned, is sufficient 4. History of postpartum depression: she had this with both of her deliveries and is concerned. She is planning on breastfeeding, so I would recommend sertraline, and would consider starting it 2 weeks prior to her planned C/S at a dose of 50mg /day 5. Type 1 diabetes: she has a longstanding relationship with a [facility] endocrinologist who wishes to remain as her primary diabetes provider. She is in acceptable control with a most recent HgbA1C of  6.7%. She sees her endocrinologist monthly and talks to ancillary staff more frequently. She had no evidence of diabetic retinopathy on her opthalmologic exam in February of this year. The 24 hour urine mentioned above will give us good evaluation of the presence or absence of diabetic nephropathy. There are no structural defects noted on US today. We have made appointments to see her every 4 weeks for growth evaluations, and have set her up for a fetal  echocardiogram. She will need antepartum testing from 30 weeks as noted above  Thank you for the opportunity to be a part of the care of Andrea Hayden. Please contact our office if we can be of further assistance.   I spent approximately 30 minutes with this patient with over 50% of time spent in face-to-face counseling.

## 2017-05-31 ENCOUNTER — Other Ambulatory Visit: Payer: Self-pay

## 2017-06-02 ENCOUNTER — Other Ambulatory Visit (HOSPITAL_COMMUNITY): Payer: Self-pay | Admitting: *Deleted

## 2017-06-02 DIAGNOSIS — O24019 Pre-existing diabetes mellitus, type 1, in pregnancy, unspecified trimester: Secondary | ICD-10-CM

## 2017-06-04 ENCOUNTER — Ambulatory Visit (HOSPITAL_COMMUNITY): Payer: BC Managed Care – PPO

## 2017-06-04 ENCOUNTER — Encounter (HOSPITAL_COMMUNITY): Payer: BC Managed Care – PPO

## 2017-06-16 LAB — HEMOGLOBIN A1C: Hemoglobin A1C: 6.8

## 2017-06-18 ENCOUNTER — Ambulatory Visit (HOSPITAL_COMMUNITY): Payer: BC Managed Care – PPO

## 2017-06-18 ENCOUNTER — Encounter (HOSPITAL_COMMUNITY): Payer: BC Managed Care – PPO

## 2017-06-22 IMAGING — US US MFM OB DETAIL+14 WK
1 series · 14 of 28 positions shown · non-contrast
Comparison: none

[Series 1: us mfm ob detail+14 wk · 69 acquisitions, 14 frames shown]
[im 3/69]
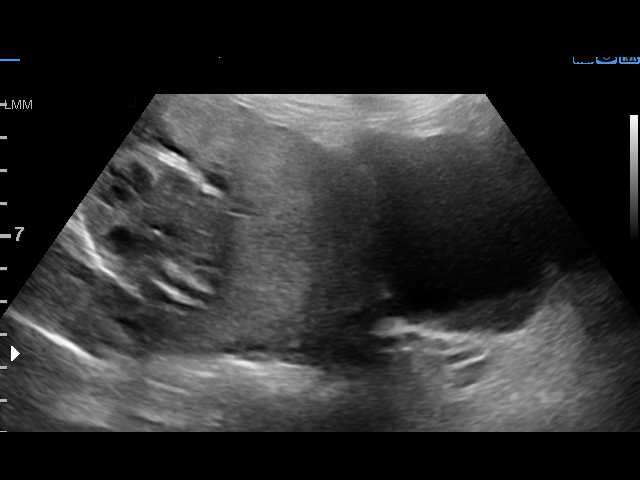
[im 8/69]
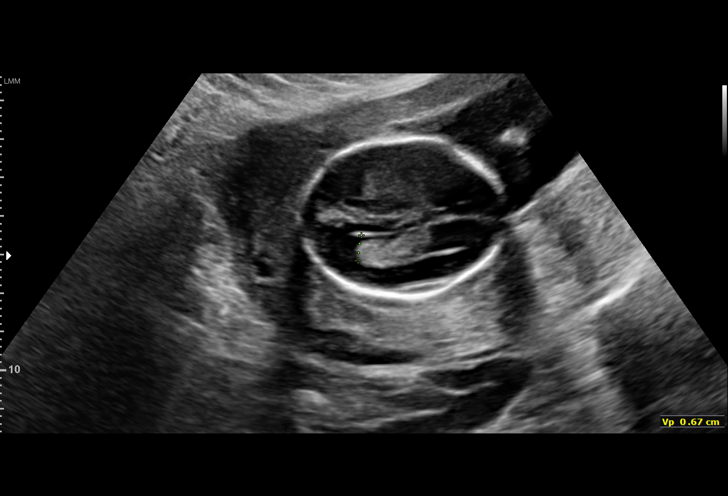
[im 13/69]
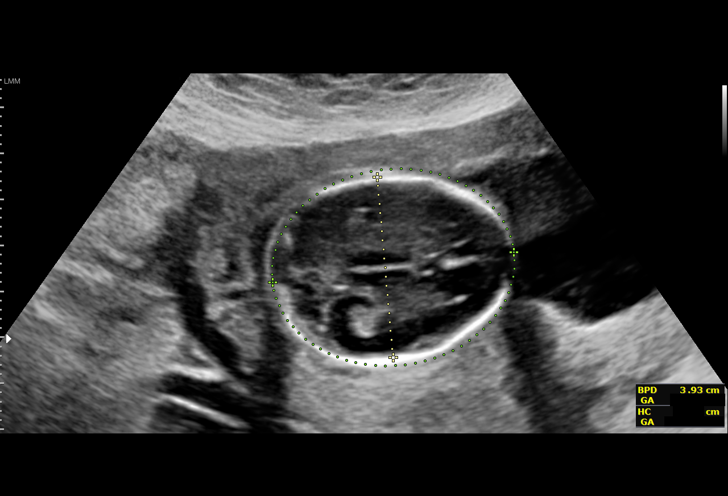
[im 18/69]
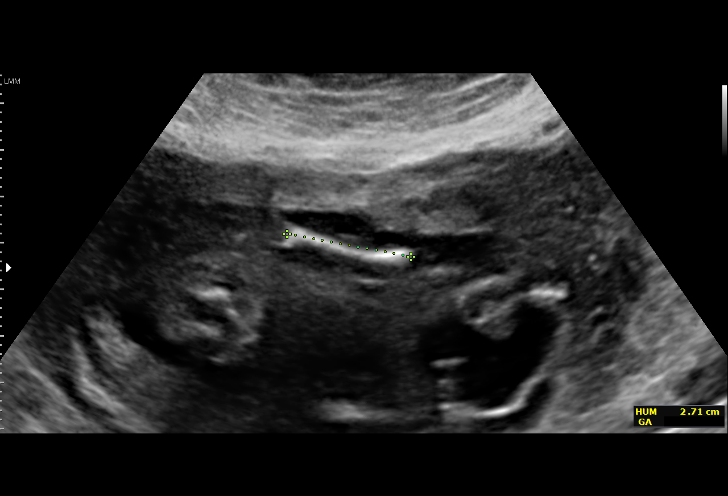
[im 23/69]
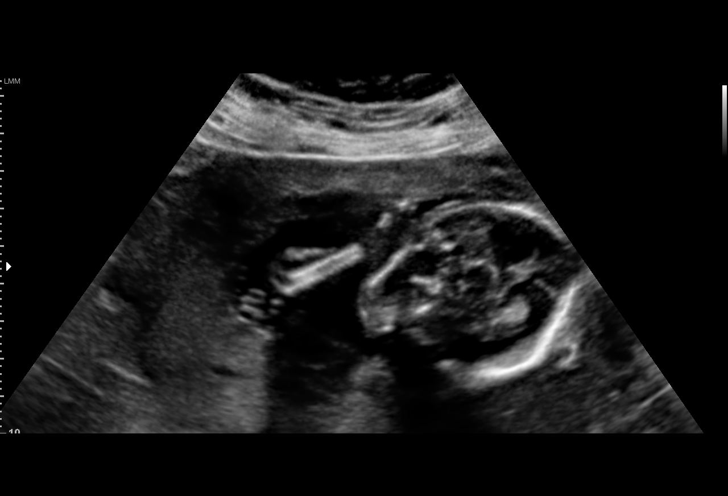
[im 28/69]
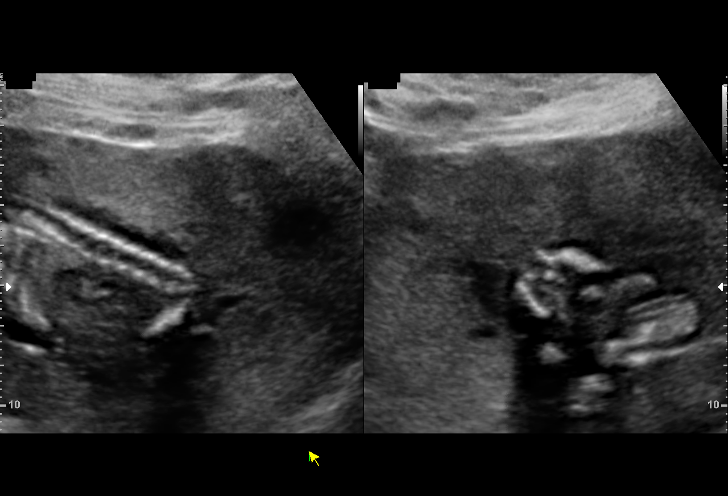
[im 33/69]
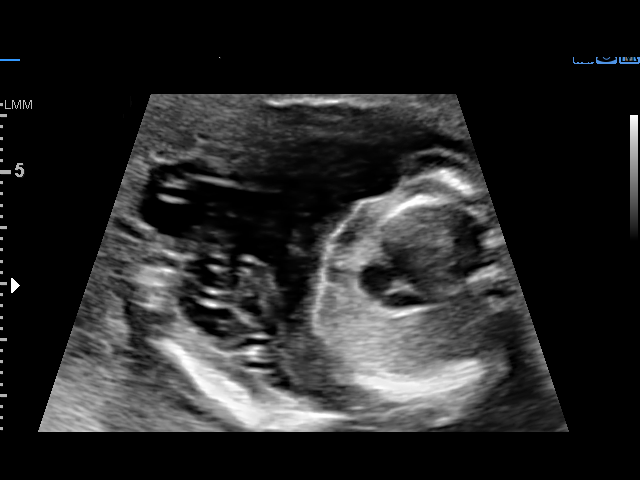
[im 38/69]
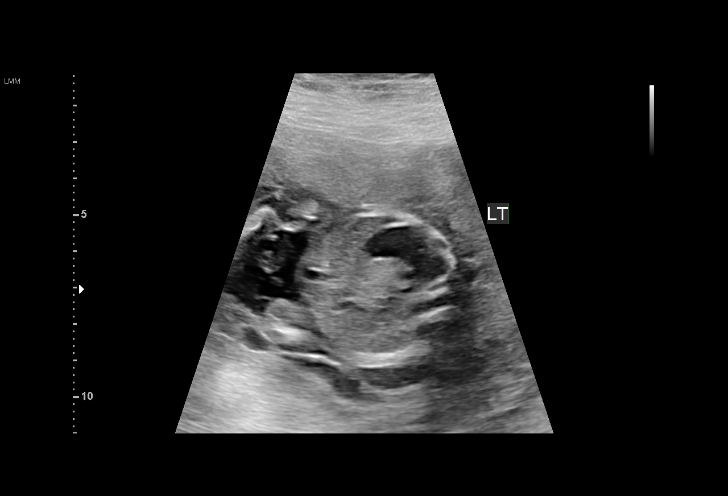
[im 43/69]
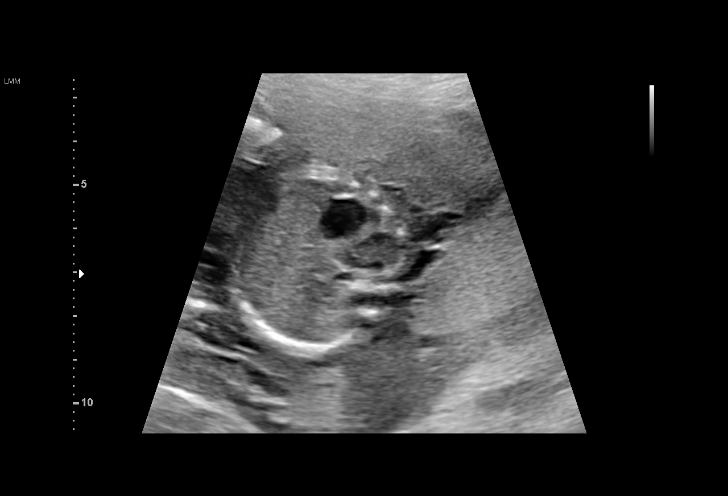
[im 48/69]
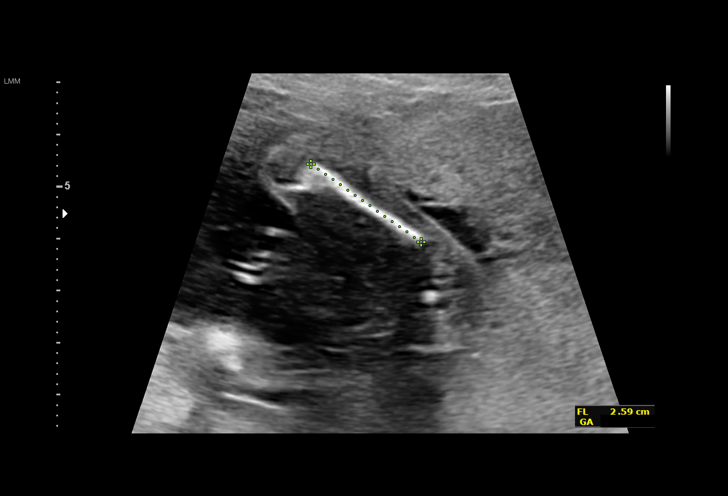
[im 53/69]
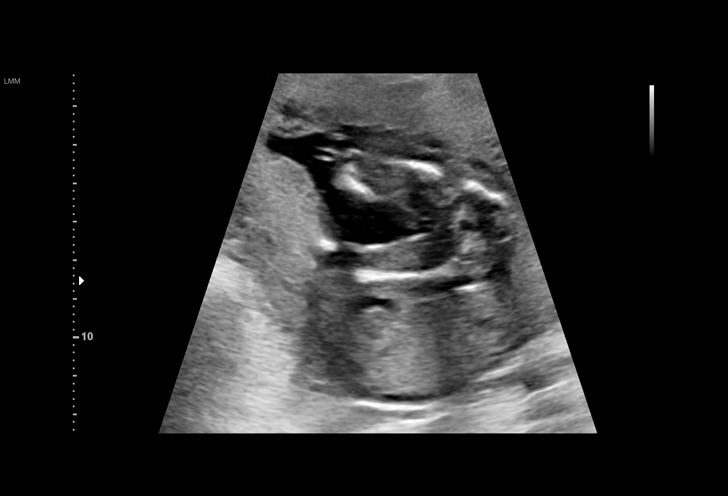
[im 58/69]
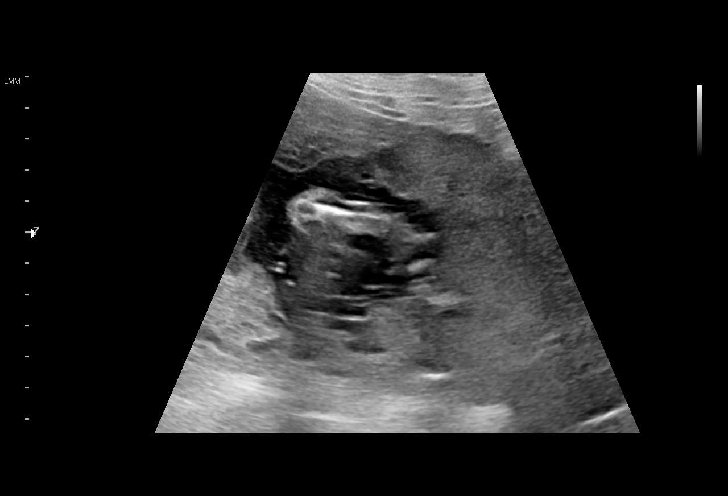
[im 63/69]
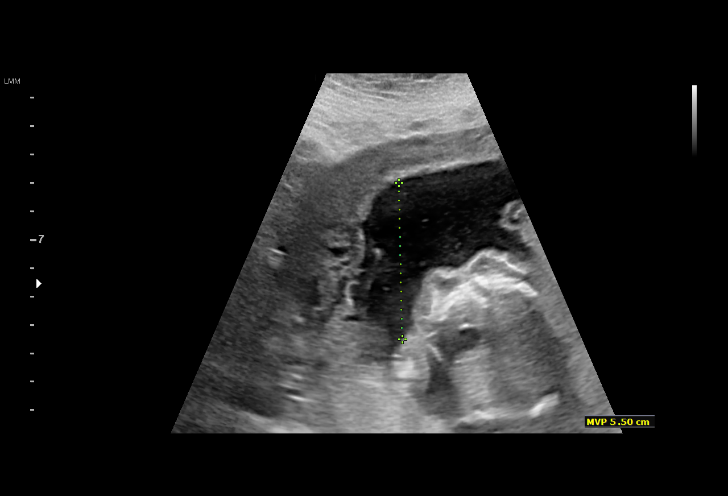
[im 69/69]
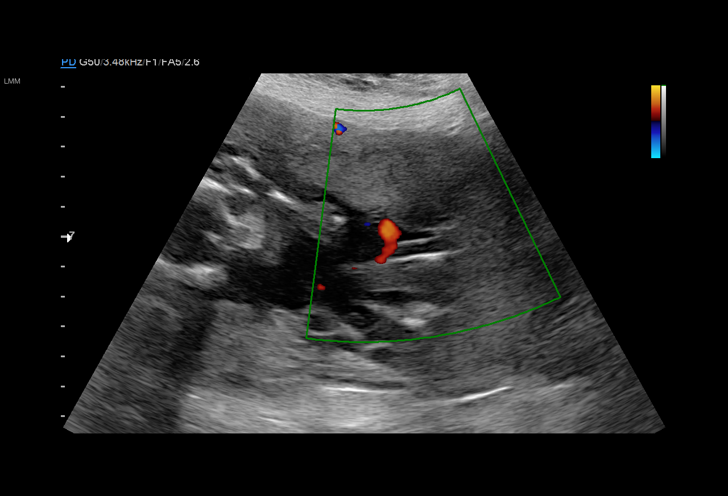

[14 of 28 positions shown; findings below may reference images not displayed]

OBGYN

1  JAMES OLIVOS             [id number]      [id number]     [id number]
Indications

17 weeks gestation of pregnancy
Encounter for fetal anatomic survey
Pre-existing diabetes, type 1, in pregnancy,
second trimester
Advanced maternal age multigravida 35+
(45), second trimester
OB History

Gravidity:    6         Term:   1        Prem:   1        SAB:   3
Living:       2
Fetal Evaluation

Num Of Fetuses:     1
Fetal Heart         154
Rate(bpm):
Cardiac Activity:   Observed
Presentation:       Breech
Placenta:           Central previa
P. Cord Insertion:  Not well visualized

Amniotic Fluid
AFI FV:      Subjectively within normal limits

Largest Pocket(cm)
5.5
Biometry

BPD:      40.2  mm     G. Age:  18w 1d         78  %    CI:        72.14   %    70 - 86
FL/HC:      17.6   %    15.8 - 18
HC:      150.6  mm     G. Age:  18w 1d         69  %    HC/AC:      1.04        1.07 -
AC:      145.2  mm     G. Age:  19w 6d       > 97  %    FL/BPD:     65.9   %
FL:       26.5  mm     G. Age:  18w 0d         63  %    FL/AC:      18.3   %    20 - 24
HUM:      26.9  mm     G. Age:  18w 4d         82  %
CER:      17.9  mm     G. Age:  17w 6d         56  %
CM:        3.3  mm

Est. FW:     263  gm      0 lb 9 oz   > 75  %
Gestational Age

LMP:           18w 2d        Date:  01/20/17                 EDD:   10/27/17
U/S Today:     18w 4d                                        EDD:   10/25/17
Best:          17w 4d     Det. By:  Previous Ultrasound      EDD:   11/01/17
(04/08/17)
Anatomy

Cranium:               Appears normal         Aortic Arch:            Not well visualized
Cavum:                 Appears normal         Ductal Arch:            Not well visualized
Ventricles:            Appears normal         Diaphragm:              Appears normal
Choroid Plexus:        Appears normal         Stomach:                Appears normal, left
sided
Cerebellum:            Appears normal         Abdomen:                Appears normal
Posterior Fossa:       Appears normal         Abdominal Wall:         Not well visualized
Nuchal Fold:           Not well visualized    Cord Vessels:           Not well visualized
Face:                  Not well visualized    Kidneys:                Appear normal
Lips:                  Appears normal         Bladder:                Appears normal
Thoracic:              Appears normal         Spine:                  Limited views
appear normal
Heart:                 Not well visualized    Upper Extremities:      Appears normal
RVOT:                  Not well visualized    Lower Extremities:      Appears normal
LVOT:                  Not well visualized

Other:  Technically difficult due to maternal habitus and fetal position. Left
heel and right 5th digit appear normal.
Cervix Uterus Adnexa

Cervix
Length:           3.91  cm.
Normal appearance by transabdominal scan.

Uterus
No abnormality visualized.

Left Ovary
Within normal limits.

Right Ovary
Within normal limits.

Cul De Sac:   No free fluid seen.

Adnexa:       No abnormality visualized.
Impression

Singleton intrauterine pregnancy at 17+4 weeks with type 1
DM and CHTN and AMA
Review of the anatomy shows no sonographic markers for
aneuploidy or structural anomalies
However, evaluations of the heart, face and ventral wall
should be considered suboptimal secondary to maternal
habitus and fetal position
Amniotic fluid volume is normal
There is a high probability of a persistent placenta previa.
The placenta is implanted centrally over the os on today's
scan
Estimated fetal weight is 263g which is growth at >75th
percentile
Recommendations

See MFM consult. Return visits every 4 weeks. Fetal echo
when appropriate. Begin antepartum testing at 30 weeks

## 2017-06-25 ENCOUNTER — Other Ambulatory Visit (HOSPITAL_COMMUNITY): Payer: Self-pay | Admitting: Obstetrics and Gynecology

## 2017-06-25 ENCOUNTER — Ambulatory Visit (HOSPITAL_COMMUNITY)
Admission: RE | Admit: 2017-06-25 | Discharge: 2017-06-25 | Disposition: A | Discharge status: Home / Self Care | Payer: Managed Care, Other (non HMO) | Source: Ambulatory Visit | Attending: Obstetrics | Admitting: Obstetrics

## 2017-06-25 ENCOUNTER — Encounter (HOSPITAL_COMMUNITY): Payer: Self-pay

## 2017-06-25 DIAGNOSIS — Z362 Encounter for other antenatal screening follow-up: Secondary | ICD-10-CM | POA: Insufficient documentation

## 2017-06-25 DIAGNOSIS — O24019 Pre-existing diabetes mellitus, type 1, in pregnancy, unspecified trimester: Secondary | ICD-10-CM | POA: Diagnosis present

## 2017-06-25 DIAGNOSIS — O4402 Placenta previa specified as without hemorrhage, second trimester: Secondary | ICD-10-CM | POA: Insufficient documentation

## 2017-06-25 DIAGNOSIS — O24012 Pre-existing diabetes mellitus, type 1, in pregnancy, second trimester: Secondary | ICD-10-CM | POA: Diagnosis not present

## 2017-06-25 DIAGNOSIS — O09522 Supervision of elderly multigravida, second trimester: Secondary | ICD-10-CM | POA: Insufficient documentation

## 2017-06-25 DIAGNOSIS — O162 Unspecified maternal hypertension, second trimester: Secondary | ICD-10-CM | POA: Insufficient documentation

## 2017-06-25 DIAGNOSIS — O34219 Maternal care for unspecified type scar from previous cesarean delivery: Secondary | ICD-10-CM | POA: Diagnosis not present

## 2017-06-25 DIAGNOSIS — O99212 Obesity complicating pregnancy, second trimester: Secondary | ICD-10-CM | POA: Diagnosis not present

## 2017-06-25 DIAGNOSIS — Z3A21 21 weeks gestation of pregnancy: Secondary | ICD-10-CM | POA: Diagnosis not present

## 2017-06-25 DIAGNOSIS — Z3A22 22 weeks gestation of pregnancy: Secondary | ICD-10-CM

## 2017-07-12 ENCOUNTER — Ambulatory Visit (INDEPENDENT_AMBULATORY_CARE_PROVIDER_SITE_OTHER): Payer: Managed Care, Other (non HMO) | Admitting: Physician Assistant

## 2017-07-12 ENCOUNTER — Encounter: Payer: Self-pay | Admitting: Physician Assistant

## 2017-07-12 VITALS — BP 132/66 | HR 80 | Temp 98.1°F | Resp 16 | Ht 64.0 in | Wt 226.0 lb

## 2017-07-12 DIAGNOSIS — Z8249 Family history of ischemic heart disease and other diseases of the circulatory system: Secondary | ICD-10-CM | POA: Diagnosis not present

## 2017-07-12 DIAGNOSIS — Z8659 Personal history of other mental and behavioral disorders: Secondary | ICD-10-CM

## 2017-07-12 DIAGNOSIS — I10 Essential (primary) hypertension: Secondary | ICD-10-CM | POA: Diagnosis not present

## 2017-07-12 DIAGNOSIS — O9989 Other specified diseases and conditions complicating pregnancy, childbirth and the puerperium: Secondary | ICD-10-CM

## 2017-07-12 DIAGNOSIS — E109 Type 1 diabetes mellitus without complications: Secondary | ICD-10-CM | POA: Diagnosis not present

## 2017-07-12 DIAGNOSIS — O99891 Other specified diseases and conditions complicating pregnancy: Secondary | ICD-10-CM

## 2017-07-12 DIAGNOSIS — O24012 Pre-existing diabetes mellitus, type 1, in pregnancy, second trimester: Secondary | ICD-10-CM

## 2017-07-12 DIAGNOSIS — Z0000 Encounter for general adult medical examination without abnormal findings: Secondary | ICD-10-CM | POA: Diagnosis not present

## 2017-07-12 DIAGNOSIS — O09522 Supervision of elderly multigravida, second trimester: Secondary | ICD-10-CM

## 2017-07-12 DIAGNOSIS — O09292 Supervision of pregnancy with other poor reproductive or obstetric history, second trimester: Secondary | ICD-10-CM

## 2017-07-12 NOTE — Progress Notes (Signed)
Patient: Andrea Hayden, Female    DOB: 1971/05/23, 46 y.o.   MRN: [id number] Visit Date: 07/12/2017  Today's Provider: Margaretann Loveless, PA-C   Chief Complaint  Patient presents with  . Annual Exam   Subjective:    Annual physical exam Andrea Hayden is a 46 y.o. female [redacted] weeks pregnant who presents today for health maintenance and complete physical. She feels well. She reports exercising active with daily activities. She reports she is sleeping well. 06/18/16 CPE  She does have T1DM and is followed by [facility]. She is followed by [facility] for women's health and current pregnancy. She is being followed as high risk. Fetal echo was recently done and appears normal. Patient declined amniocentesis because she did not want to risk the pregnancy and stated she was going to have the baby no matter what was found. She does report that they do feel she is placenta previa and are doing more testing to see if it is placenta accreta. Her OB and her endocrinologist have been following her every 4 weeks. She did develop preeclampsia with her last birth and had to have a C-section at 35 weeks.  -----------------------------------------------------------------   Review of Systems  Constitutional: Negative.   HENT: Negative.   Eyes: Negative.   Respiratory: Negative.   Cardiovascular: Negative.   Gastrointestinal: Negative.   Endocrine: Negative.   Genitourinary: Negative.   Musculoskeletal: Negative.   Skin: Negative.   Allergic/Immunologic: Negative.   Neurological: Negative.   Hematological: Negative.   Psychiatric/Behavioral: Negative.     Social History      She  reports that she has never smoked. She has never used smokeless tobacco. She reports that she does not drink alcohol or use drugs.       Social History   Social History  . Marital status: Married    Spouse name: N/A  . Number of children: N/A  . Years of education: N/A   Social  History Main Topics  . Smoking status: Never Smoker  . Smokeless tobacco: Never Used  . Alcohol use No  . Drug use: No  . Sexual activity: Yes    Birth control/ protection: None   Other Topics Concern  . None   Social History Narrative  . None    Past Medical History:  Diagnosis Date  . Abnormal CT scan    Abdominal  . Allergy   . Anxiety 09/14/2007   Osteoarthritis  . Depression   . Diabetes mellitus without complication (HCC)    Type 1: external insulin pump  . GERD (gastroesophageal reflux disease)   . 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 Hypertension   . 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 Sleep disorder   . 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 Type 1 diabetes mellitus in pregnancy 05/28/2017  . Chronic hypertension affecting pregnancy 05/28/2017  . Hx of preeclampsia, prior pregnancy, currently pregnant, second trimester 05/28/2017  . Advanced maternal age in multigravida, second trimester 05/28/2017  . Hx of postpartum depression, currently pregnant 05/28/2017  . DDD (degenerative disc disease), cervical 09/07/2016  . Abnormal CAT scan 06/17/2016  . Dysfunction of eustachian tube 06/17/2016  . Arthralgia of hip 06/17/2016  . BP (high blood pressure) 06/17/2016  . Disordered sleep 06/17/2016  . Adult BMI 30+ 11/09/2014  . Accumulation of fluid in tissues 03/05/2014  . Hypercholesterolemia 08/05/2013  .  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 Arthritis, degenerative 09/14/2007  . Esophagitis, reflux 07/07/2006  . Clinical depression 11/12/2005  . Type 1 diabetes mellitus (HCC) 11/12/2005  . 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 CESAREAN SECTION    . CESAREAN SECTION WITH BILATERAL TUBAL LIGATION N/A 09/11/2013   Procedure: Repeat  CESAREAN SECTION of baby boy at 1945 APGAR  WITH BILATERAL TUBAL LIGATION;  Surgeon: Freddrick MarchKendra H. Tenny Crawoss, MD;  Location: [facility];  Service: Obstetrics;  Laterality: N/A;  . CHOLECYSTECTOMY  2010  . DILATION AND CURETTAGE OF UTERUS    . Sleep Study  04/14/2015   Home sleep study showing no desaturations.   . TUBAL LIGATION      Family History        Family Status  Relation Status  . Mother Alive  . Father Alive       Heart Ablation  . PGM Alive  . PGF Alive       Scoliosis,Bladder infections  . Daughter Alive  . MGM Deceased  . MGF Deceased       Dementia        Her family history includes Cancer in her paternal grandfather and paternal grandmother; Diabetes in her paternal grandmother; Hyperlipidemia in her mother and paternal grandmother; Hypertension in her father, mother, paternal grandfather, and paternal grandmother.     No Known Allergies   Current Outpatient Prescriptions:  .  acetaminophen (TYLENOL) 325 MG tablet, Take 650 mg by mouth daily as needed for pain. Reported on 06/18/2016, Disp: , Rfl:  .  ASPIRIN 81 PO, Take by mouth., Disp: , Rfl:  .  BAYER CONTOUR NEXT TEST test strip, Reported on 06/18/2016, Disp: , Rfl: 0 .  Insulin Human (INSULIN PUMP) 100 unit/ml SOLN, Inject 1 each into the skin See admin instructions. Insulin pump, Novolog insulin, Disp: , Rfl:  .  NIFEdipine (PROCARDIA PO), Take 30 mg by mouth daily. , Disp: , Rfl:  .  NOVOLOG 100 UNIT/ML injection, , Disp: , Rfl: 10 .  omega-3 fish oil (MAXEPA) 1000 MG CAPS capsule, Take by mouth., Disp: , Rfl:  .  Prenatal Multivit-Min-Fe-FA (PRENATAL VITAMINS) 0.8 MG tablet, Prenatal Vitamins, Disp: , Rfl:    Patient Care Team: Margaretann LovelessBurnette, Jennifer M, PA-C as PCP - General (Family Medicine)      Objective:   Vitals: BP 132/66 (BP Location: Left Arm, Patient Position: Sitting, Cuff Size: Large)   Pulse 80   Temp 98.1 F (36.7 C) (Oral)   Resp 16   Ht 5\' 4"  (1.626 m)   Wt 226 lb (102.5 kg)   LMP 01/20/2017    SpO2 98%   BMI 38.79 kg/m    Vitals:   07/12/17 1017  BP: 132/66  Pulse: 80  Resp: 16  Temp: 98.1 F (36.7 C)  TempSrc: Oral  SpO2: 98%  Weight: 226 lb (102.5 kg)  Height: 5\' 4"  (1.626 m)     Physical Exam  Constitutional: She appears well-developed and well-nourished. No distress.  HENT:  Head: Normocephalic and atraumatic.  Right Ear: Hearing, tympanic membrane, external ear and ear canal normal.  Left Ear: Hearing, tympanic membrane, external ear and ear canal normal.  Nose: Nose normal.  Mouth/Throat: Uvula is midline, oropharynx is clear and moist and mucous membranes are normal. No oropharyngeal exudate.  Eyes: Pupils are equal, round, and reactive to light. Conjunctivae are normal.  Right eye exhibits no discharge. Left eye exhibits no discharge. No scleral icterus.  Neck: Normal range of motion. Neck supple. No JVD present. No tracheal deviation present. No thyromegaly present.  Cardiovascular: Normal rate, regular rhythm and normal heart sounds.  Exam reveals no gallop and no friction rub.   No murmur heard. Pulmonary/Chest: Effort normal and breath sounds normal. No stridor. No respiratory distress. She has no wheezes. She has no rales.  Abdominal: Soft. Bowel sounds are normal. There is no tenderness.  Genitourinary:  Genitourinary Comments: Deferred to gyn; pregnant  Lymphadenopathy:    She has no cervical adenopathy.  Skin: Skin is warm and dry. She is not diaphoretic.  Vitals reviewed.    Depression Screen No flowsheet data found.    Assessment & Plan:     Routine Health Maintenance and Physical Exam  Exercise Activities and Dietary recommendations Goals    None      Immunization History  Administered Date(s) Administered  . Influenza,inj,Quad PF,36+ Mos 09/13/2013  . Tdap 11/23/2005, 09/12/2013    Health Maintenance  Topic Date Due  . PNEUMOCOCCAL POLYSACCHARIDE VACCINE (1) 09/22/1974  . FOOT EXAM  09/22/1982  . URINE MICROALBUMIN   09/22/1982  . HIV Screening  09/23/1987  . PAP SMEAR  04/24/2016  . INFLUENZA VACCINE  07/28/2017  . HEMOGLOBIN A1C  12/16/2017  . OPHTHALMOLOGY EXAM  02/08/2018  . TETANUS/TDAP  09/13/2023     Discussed health benefits of physical activity, and encouraged her to engage in regular exercise appropriate for her age and condition.    1. Annual physical exam Normal physical exam. EKG done today and showed NSR with rate of 75, No ST changes or arrhythmia noted. Copy given to patient to take to OB.   2. Essential hypertension Stable on Procardia 30mg  daily.   3. Type 1 diabetes mellitus during pregnancy in second trimester Followed by [facility] and has f/u this afternoon. Being seen every 4 weeks currently due to pregnancy. Pump dependent. Last A1c was 6.8.  - EKG 12-Lead  4. Type 1 diabetes mellitus without complication (HCC) See above medical treatment plan. - EKG 12-Lead  5. Advanced maternal age in multigravida, second trimester - EKG 12-Lead  6. Hx of preeclampsia, prior pregnancy, currently pregnant, second trimester Currently BP is stable and doing well with Procardia 30mg .  - EKG 12-Lead  7. Hx of postpartum depression, currently pregnant OB offered zoloft at 3rd trimester, but patient states she may not start until after birth so she can see how she truly reacts. Previously used lexapro and responded well. After 1st child she was tearful, and after 2nd she had more anxiety.  - EKG 12-Lead  8. Family history of cardiac arrhythmia Father had ablation. - EKG 12-Lead  --------------------------------------------------------------------    Margaretann Loveless, PA-C  [facility]

## 2017-07-12 NOTE — Patient Instructions (Signed)

## 2017-07-30 ENCOUNTER — Other Ambulatory Visit (HOSPITAL_COMMUNITY): Payer: Self-pay | Admitting: Obstetrics and Gynecology

## 2017-07-30 ENCOUNTER — Ambulatory Visit (HOSPITAL_COMMUNITY)
Admission: RE | Admit: 2017-07-30 | Discharge: 2017-07-30 | Disposition: A | Discharge status: Home / Self Care | Payer: Managed Care, Other (non HMO) | Source: Ambulatory Visit | Attending: Obstetrics | Admitting: Obstetrics

## 2017-07-30 ENCOUNTER — Encounter (HOSPITAL_COMMUNITY): Payer: Self-pay

## 2017-07-30 DIAGNOSIS — Z9641 Presence of insulin pump (external) (internal): Secondary | ICD-10-CM | POA: Insufficient documentation

## 2017-07-30 DIAGNOSIS — Z3A26 26 weeks gestation of pregnancy: Secondary | ICD-10-CM

## 2017-07-30 DIAGNOSIS — E108 Type 1 diabetes mellitus with unspecified complications: Secondary | ICD-10-CM | POA: Diagnosis not present

## 2017-07-30 DIAGNOSIS — O34219 Maternal care for unspecified type scar from previous cesarean delivery: Secondary | ICD-10-CM | POA: Diagnosis not present

## 2017-07-30 DIAGNOSIS — O24019 Pre-existing diabetes mellitus, type 1, in pregnancy, unspecified trimester: Secondary | ICD-10-CM

## 2017-07-30 DIAGNOSIS — O4402 Placenta previa specified as without hemorrhage, second trimester: Secondary | ICD-10-CM

## 2017-07-30 DIAGNOSIS — O99212 Obesity complicating pregnancy, second trimester: Secondary | ICD-10-CM

## 2017-07-30 DIAGNOSIS — O09522 Supervision of elderly multigravida, second trimester: Secondary | ICD-10-CM

## 2017-07-30 DIAGNOSIS — O24912 Unspecified diabetes mellitus in pregnancy, second trimester: Secondary | ICD-10-CM | POA: Diagnosis not present

## 2017-08-18 DIAGNOSIS — O43213 Placenta accreta, third trimester: Secondary | ICD-10-CM | POA: Insufficient documentation

## 2017-08-18 DIAGNOSIS — Z98891 History of uterine scar from previous surgery: Secondary | ICD-10-CM | POA: Insufficient documentation

## 2017-08-18 DIAGNOSIS — Z9889 Other specified postprocedural states: Secondary | ICD-10-CM | POA: Insufficient documentation

## 2017-08-24 ENCOUNTER — Telehealth: Payer: Self-pay

## 2017-08-24 DIAGNOSIS — I493 Ventricular premature depolarization: Secondary | ICD-10-CM | POA: Insufficient documentation

## 2017-08-24 NOTE — Telephone Encounter (Signed)
Noted thank you

## 2017-08-24 NOTE — Telephone Encounter (Signed)
Patient reports that she just had her HgbA1c checked last week and it was 6.0. She reports that she will call her OB to see if she needs to be seen in the office. She states that her symptoms have gotten slightly better, but it is still there. She sends her thanks.

## 2017-08-24 NOTE — Telephone Encounter (Signed)
Patient called saying that she has felt like her heart is skipping a beat ever since she woke up this morning. She denies any chest pain, shortness of breath, nausea, or numbness and tingling. She reports that she took her BP this morning and it was 129/63 with a heart rate of 74. She reports that she also checked her carotid pulse and while counting, it seemed to be skipping beats.   She reports that her OB is now in [place] due to her having placenta previa. She did not know if you had any recommendations for her instead of her going to [place] to be evaluated. Please advise. Contact number is correct. Thanks!

## 2017-08-24 NOTE — Telephone Encounter (Signed)
I would recommend her notify her OB to see if they need to see her. If not she could come here for an EKG, but there isn't much else we could do. She could always go to ER for further evaluation as well since she is so far along in her pregnancy and high risk pregnancy at that.  Is her sugars ok as well?

## 2017-08-27 ENCOUNTER — Encounter (HOSPITAL_COMMUNITY): Payer: Self-pay

## 2017-08-27 ENCOUNTER — Ambulatory Visit (HOSPITAL_COMMUNITY): Payer: BC Managed Care – PPO

## 2017-09-21 HISTORY — PX: OTHER SURGICAL HISTORY: SHX169

## 2017-09-24 ENCOUNTER — Ambulatory Visit (HOSPITAL_COMMUNITY): Payer: BC Managed Care – PPO

## 2017-10-21 LAB — HM DIABETES FOOT EXAM: HM Diabetic Foot Exam: NORMAL

## 2017-11-03 LAB — HM HIV SCREENING LAB: HM HIV Screening: NEGATIVE

## 2017-11-16 ENCOUNTER — Encounter: Payer: Self-pay | Admitting: Physician Assistant

## 2017-11-17 ENCOUNTER — Ambulatory Visit (INDEPENDENT_AMBULATORY_CARE_PROVIDER_SITE_OTHER): Payer: Managed Care, Other (non HMO) | Admitting: Physician Assistant

## 2017-11-17 ENCOUNTER — Encounter: Payer: Self-pay | Admitting: Physician Assistant

## 2017-11-17 ENCOUNTER — Ambulatory Visit
Admission: RE | Admit: 2017-11-17 | Discharge: 2017-11-17 | Disposition: A | Discharge status: Home / Self Care | Payer: Managed Care, Other (non HMO) | Source: Ambulatory Visit | Attending: Physician Assistant | Admitting: Physician Assistant

## 2017-11-17 ENCOUNTER — Telehealth: Payer: Self-pay

## 2017-11-17 VITALS — BP 118/84 | HR 64 | Temp 97.8°F | Resp 16 | Ht 64.0 in | Wt 211.0 lb

## 2017-11-17 DIAGNOSIS — E109 Type 1 diabetes mellitus without complications: Secondary | ICD-10-CM

## 2017-11-17 DIAGNOSIS — M79641 Pain in right hand: Secondary | ICD-10-CM

## 2017-11-17 DIAGNOSIS — M79642 Pain in left hand: Secondary | ICD-10-CM

## 2017-11-17 DIAGNOSIS — Z9289 Personal history of other medical treatment: Secondary | ICD-10-CM | POA: Insufficient documentation

## 2017-11-17 DIAGNOSIS — M7989 Other specified soft tissue disorders: Secondary | ICD-10-CM | POA: Diagnosis not present

## 2017-11-17 MED ORDER — MELOXICAM 15 MG PO TABS: 15.0000 mg | ORAL_TABLET | Freq: Every day | ORAL | 0 refills | Status: DC

## 2017-11-17 NOTE — Patient Instructions (Signed)
Hand Pain  Many things can cause hand pain. Some common causes are:  ? An injury.  ? Repeating the same movement with your hand over and over (overuse).  ? Osteoporosis.  ? Arthritis.  ? Lumps in the tendons or joints of the hand and wrist (ganglion cysts).  ? Infection.  Follow these instructions at home:  Pay attention to any changes in your symptoms. Take these actions to help with your discomfort:  ? If directed, put ice on the affected area:  ? Put ice in a plastic bag.  ? Place a towel between your skin and the bag.  ? Leave the ice on for 15?20 minutes, 3?4 times a day for 2 days.  ? Take over-the-counter and prescription medicines only as told by your health care provider.  ? Minimize stress on your hands and wrists as much as possible.  ? Take breaks from repetitive activity often.  ? Do stretches as told by your health care provider.  ? Do not do activities that make your pain worse.  Contact a health care provider if:  ? Your pain does not get better after a few days of self-care.  ? Your pain gets worse.  ? Your pain affects your ability to do your daily activities.  Get help right away if:  ? Your hand becomes warm, red, or swollen.  ? Your hand is numb or tingling.  ? Your hand is extremely swollen or deformed.  ? Your hand or fingers turn white or blue.  ? You cannot move your hand, wrist, or fingers.  This information is not intended to replace advice given to you by your health care provider. Make sure you discuss any questions you have with your health care provider.  Document Released: 01/10/2016 Document Revised: 05/21/2016 Document Reviewed: 01/09/2015  Elsevier Interactive Patient Education ? 2018 [employer]vier Inc[employer]

## 2017-11-17 NOTE — Telephone Encounter (Signed)
Patient advised as below. Patient verbalizes understanding and is in agreement with treatment plan.  

## 2017-11-17 NOTE — Telephone Encounter (Signed)
-----   Message from Margaretann LovelessJennifer M Burnette, [place]PA-C sent at 11/17/2017 12:02 PM EST ----- Left hand xray does show some degenerative changes in the joint spaces of the fingers. Will await lab results and see if meloxicam helps lessen symptoms.

## 2017-11-17 NOTE — Telephone Encounter (Signed)
-----   Message from Margaretann LovelessJennifer M Burnette, [place]PA-C sent at 11/17/2017 12:01 PM EST ----- Right hand xray shows no degenerative changes but does show soft tissue swelling over the fingers. Will see if meloxicam improves symptoms while we await lab results.

## 2017-11-17 NOTE — Progress Notes (Signed)
Patient: Andrea Hayden Female    DOB: 07-May-1971   46 y.o.   MRN: [id number] Visit Date: 11/17/2017  Today's Provider: Mar Daring, PA-C   Chief Complaint  Patient presents with  . Joint Pain   Subjective:    HPI Patient here today C/O joint pain/stiffness in hands and feet x's 3 weeks. Patient reports she did have some problems during her pregnancy with pain and swelling on hands and feet. Patient reports she has been taking Tylenol and reports mild pain improvement. She is now having stiffness in bilateral hands, limited ROM in thumbs bilaterally (decreased flexion), and triggering into flexion of the left ring finger.   She did have complications with her most recent pregnancy. Baby girl, Gareth Eagle, was born on 09/21/17. She was conceived after patient has had tubal ligation many years prior. She is T1DM and had previous complications with previous pregnancies. She was admitted for C-section and hysterectomy on 09/21/17. 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 Baby girl was born via c-section without issues. During surgery patient required 21 units of PRBC. She reports doing well now. Dictation #1 [id number]  [id number]     No Known Allergies   Current Outpatient Medications:  .  acetaminophen (TYLENOL) 325 MG tablet, Take 650 mg by mouth daily as needed for pain. Reported on 06/18/2016, Disp: , Rfl:  .  BAYER CONTOUR NEXT TEST test strip, Reported on 06/18/2016, Disp: , Rfl: 0 .  escitalopram (LEXAPRO) 10 MG tablet, Take 10 mg by mouth., Disp: , Rfl:  .  hydrochlorothiazide (HYDRODIURIL) 25 MG tablet, Take 25 mg by mouth., Disp: , Rfl:  .  Insulin Human (INSULIN PUMP) 100 unit/ml SOLN, Inject 1 each into the skin See admin instructions. Insulin pump, Novolog insulin, Disp: , Rfl:  .  metoprolol tartrate (LOPRESSOR) 25 MG tablet, Take 12.5 mg by mouth 2 (two) times daily., Disp: , Rfl:  .  NIFEdipine (PROCARDIA PO), Take 30 mg by mouth daily. , Disp: , Rfl:  .  NOVOLOG 100  UNIT/ML injection, , Disp: , Rfl: 10 .  omega-3 fish oil (MAXEPA) 1000 MG CAPS capsule, Take by mouth., Disp: , Rfl:  .  ASPIRIN 81 PO, Take by mouth., Disp: , Rfl:  .  meloxicam (MOBIC) 15 MG tablet, Take 1 tablet (15 mg total) by mouth daily., Disp: 30 tablet, Rfl: 0  Review of Systems  Respiratory: Negative.   Cardiovascular: Negative.   Gastrointestinal: Negative.   Musculoskeletal: Positive for arthralgias and joint swelling.  Allergic/Immunologic: Negative.   Neurological: Positive for weakness. Negative for numbness.    Social History   Tobacco Use  . Smoking status: Never Smoker  . Smokeless tobacco: Never Used  Substance Use Topics  . Alcohol use: No   Objective:   BP 118/84 (BP Location: Left Arm, Patient Position: Sitting, Cuff Size: Large)   Pulse 64   Temp 97.8 F (36.6 C)   Resp 16   Ht _0  (1.626 m)   Wt 211 lb (95.7 kg)   LMP 01/20/2017   SpO2 97%   Breastfeeding? No   BMI 36.22 kg/m  Vitals:   11/17/17 0821  BP: 118/84  Pulse: 64  Resp: 16  Temp: 97.8 F (36.6 C)  SpO2: 97%  Weight: 211 lb (95.7 kg)  Height: _1  (1.626 m)     Physical Exam  Constitutional: She appears well-developed and well-nourished. No distress.  Neck: Normal range of motion. Neck supple.  Cardiovascular: Normal rate,  regular rhythm and normal heart sounds. Exam reveals no gallop and no friction rub.  No murmur heard. Pulmonary/Chest: Effort normal and breath sounds normal. No respiratory distress. She has no wheezes. She has no rales.  Musculoskeletal:       Right wrist: Normal.       Left wrist: Normal.       Right hand: She exhibits decreased range of motion, tenderness and swelling. She exhibits no bony tenderness, normal two-point discrimination and normal capillary refill. Normal sensation noted. Decreased strength noted. She exhibits finger abduction and thumb/finger opposition. She exhibits no wrist extension trouble.       Left hand: She exhibits decreased  range of motion, tenderness, disruption of two-point discrimination, deformity (left ring finger triggered into flexion) and swelling. She exhibits no bony tenderness and normal capillary refill. Normal sensation noted. Decreased strength noted. She exhibits finger abduction and thumb/finger opposition. She exhibits no wrist extension trouble.  Skin: She is not diaphoretic.  Vitals reviewed.      Assessment & Plan:     1. Bilateral hand pain Will get imaging as below. Will check labs for auto immune issues. Possible reaction due to multiple transfusions vs RA vs autoimmune vs OA. Meloxicam given for pain and inflammation. May use tylenol between for breakthrough pain. Pending results may consider referral to ortho vs rheumatology. Consider OT.  - meloxicam (MOBIC) 15 MG tablet; Take 1 tablet (15 mg total) by mouth daily.  Dispense: 30 tablet; Refill: 0 - CBC w/Diff/Platelet - C-reactive protein - Sed Rate (ESR) - ANA,IFA RA Diag Pnl w/rflx Tit/Patn - DG Hand Complete Left; Future - DG Hand Complete Right; Future  2. History of blood transfusion 21 units transfused during C-section/hysterectomy on 09/21/17. - CBC w/Diff/Platelet - C-reactive protein - Sed Rate (ESR) - ANA,IFA RA Diag Pnl w/rflx Tit/Patn - DG Hand Complete Left; Future - DG Hand Complete Right; Future  3. Type 1 diabetes mellitus without complication ([place]) Followed by [facility]. Patient currently doing well.  - CBC w/Diff/Platelet - C-reactive protein - Sed Rate (ESR) - ANA,IFA RA Diag Pnl w/rflx Tit/Patn - DG Hand Complete Left; Future - DG Hand Complete Right; Future       Mar Daring, PA-C  [facility]

## 2017-11-19 LAB — CBC WITH DIFFERENTIAL/PLATELET
Basophils Absolute: 39 cells/uL (ref 0–200)
Basophils Relative: 0.7 %
EOS ABS: 99 {cells}/uL (ref 15–500)
Eosinophils Relative: 1.8 %
HEMATOCRIT: 39.7 % (ref 35.0–45.0)
Hemoglobin: 13.7 g/dL (ref 11.7–15.5)
LYMPHS ABS: 1364 {cells}/uL (ref 850–3900)
MCH: 29.8 pg (ref 27.0–33.0)
MCHC: 34.5 g/dL (ref 32.0–36.0)
MCV: 86.3 fL (ref 80.0–100.0)
MPV: 10 fL (ref 7.5–12.5)
Monocytes Relative: 9.1 %
NEUTROS PCT: 63.6 %
Neutro Abs: 3498 cells/uL (ref 1500–7800)
Platelets: 279 10*3/uL (ref 140–400)
RBC: 4.6 10*6/uL (ref 3.80–5.10)
RDW: 13 % (ref 11.0–15.0)
Total Lymphocyte: 24.8 %
WBC: 5.5 10*3/uL (ref 3.8–10.8)
WBCMIX: 501 {cells}/uL (ref 200–950)

## 2017-11-19 LAB — C-REACTIVE PROTEIN: CRP: 5.1 mg/L (ref <8.0)

## 2017-11-19 LAB — ANA,IFA RA DIAG PNL W/RFLX TIT/PATN
Anti Nuclear Antibody(ANA): NEGATIVE
Cyclic Citrullin Peptide Ab: <16 UNITS
Rhuematoid fact SerPl-aCnc: <14 IU/mL (ref <14)

## 2017-11-19 LAB — SEDIMENTATION RATE: Sed Rate: 17 mm/h (ref 0–20)

## 2017-11-22 ENCOUNTER — Telehealth: Payer: Self-pay

## 2017-11-22 DIAGNOSIS — M79641 Pain in right hand: Secondary | ICD-10-CM

## 2017-11-22 DIAGNOSIS — M79642 Pain in left hand: Principal | ICD-10-CM

## 2017-11-22 NOTE — Telephone Encounter (Signed)
Patient advised as below. Thumbs are still "locked" and left hand is still having some stiffness. Patient reports that left hand is better with meloxicam through out the day. Patient reports she is willing to try ortho.

## 2017-11-22 NOTE — Telephone Encounter (Signed)
-----   Message from Margaretann LovelessJennifer M Burnette, PA-C sent at 11/22/2017  8:39 AM EST ----- All labs are WNL. How are hands doing with meloxicam?

## 2017-11-22 NOTE — Telephone Encounter (Signed)
Referral placed.

## 2017-11-22 NOTE — Telephone Encounter (Signed)
[email or web address]

## 2017-11-23 ENCOUNTER — Encounter: Payer: Self-pay | Admitting: Physician Assistant

## 2017-11-26 ENCOUNTER — Encounter: Payer: Self-pay | Admitting: Physician Assistant

## 2017-12-11 ENCOUNTER — Other Ambulatory Visit: Payer: Self-pay | Admitting: Physician Assistant

## 2017-12-11 DIAGNOSIS — M79642 Pain in left hand: Principal | ICD-10-CM

## 2017-12-11 DIAGNOSIS — M79641 Pain in right hand: Secondary | ICD-10-CM

## 2017-12-22 ENCOUNTER — Other Ambulatory Visit: Payer: Self-pay | Admitting: Obstetrics & Gynecology

## 2017-12-22 DIAGNOSIS — R928 Other abnormal and inconclusive findings on diagnostic imaging of breast: Secondary | ICD-10-CM

## 2017-12-27 ENCOUNTER — Ambulatory Visit
Admission: RE | Admit: 2017-12-27 | Discharge: 2017-12-27 | Disposition: A | Discharge status: Home / Self Care | Payer: BC Managed Care – PPO | Source: Ambulatory Visit | Attending: Obstetrics & Gynecology | Admitting: Obstetrics & Gynecology

## 2017-12-27 ENCOUNTER — Ambulatory Visit: Payer: BC Managed Care – PPO

## 2017-12-27 DIAGNOSIS — R928 Other abnormal and inconclusive findings on diagnostic imaging of breast: Secondary | ICD-10-CM

## 2018-01-21 IMAGING — MG 2D DIGITAL DIAGNOSTIC UNILATERAL LEFT MAMMOGRAM WITH CAD AND ADJ
6 series · 6 of 14 positions shown · non-contrast
Comparison: December 16, 2017 and earlier priors

CLINICAL DATA: 46-year-old patient recalled from recent screening
mammogram for evaluation of a possible asymmetry in the left breast.

EXAM:
2D DIGITAL DIAGNOSTIC UNILATERAL LEFT MAMMOGRAM WITH CAD AND ADJUNCT
TOMO

[L CC]
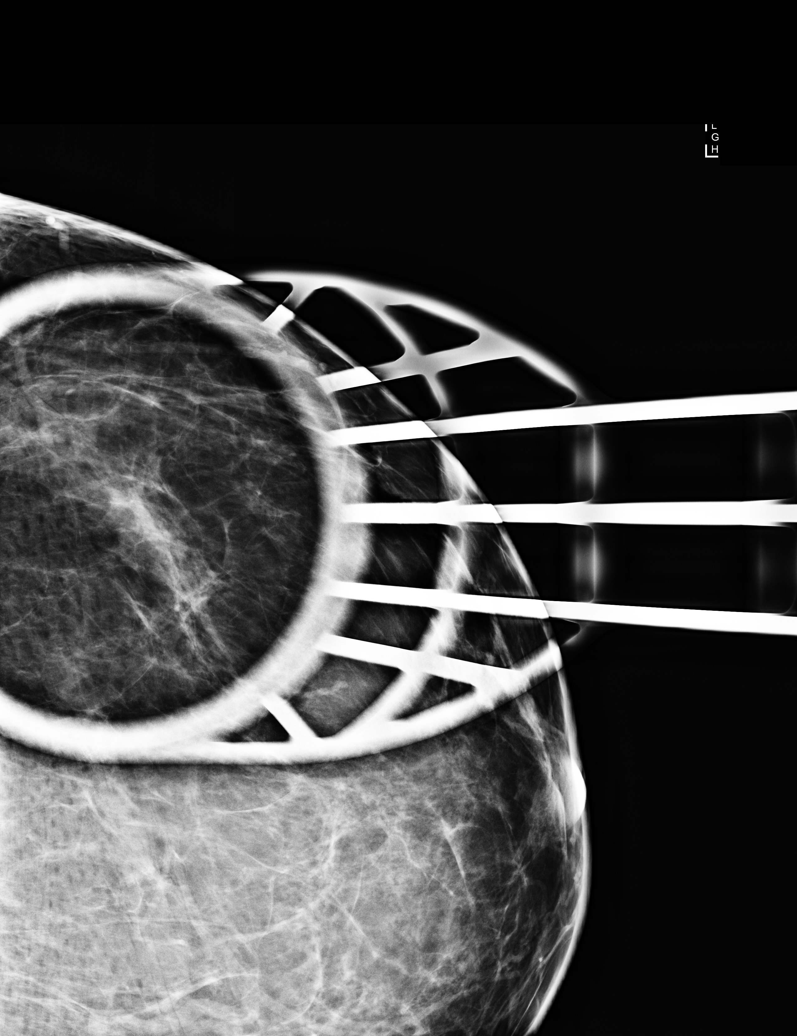

[L ML]
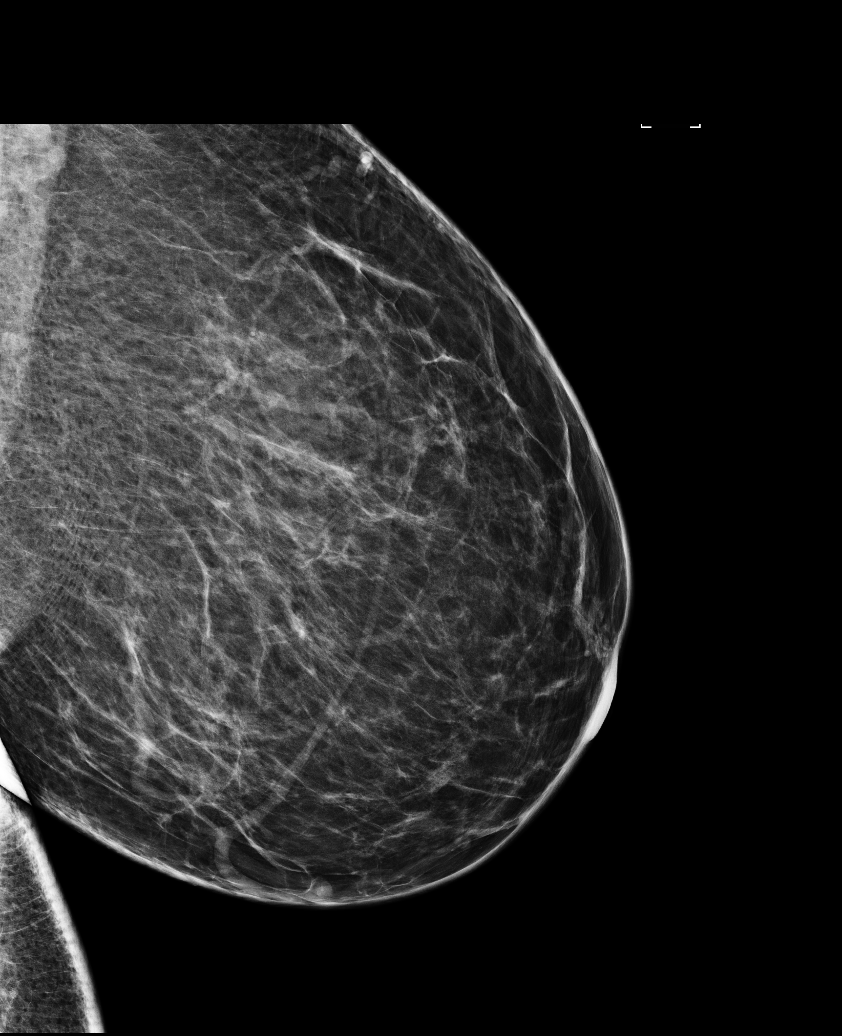

[L CC synth-2D]
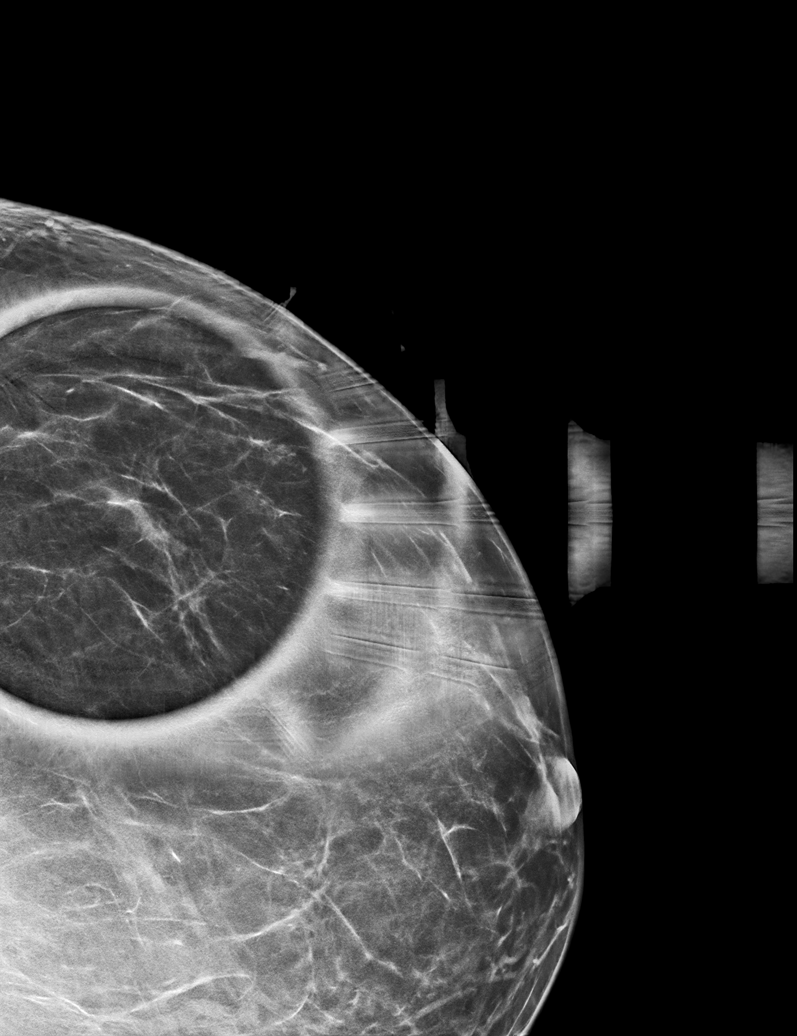

[L ML synth-2D]
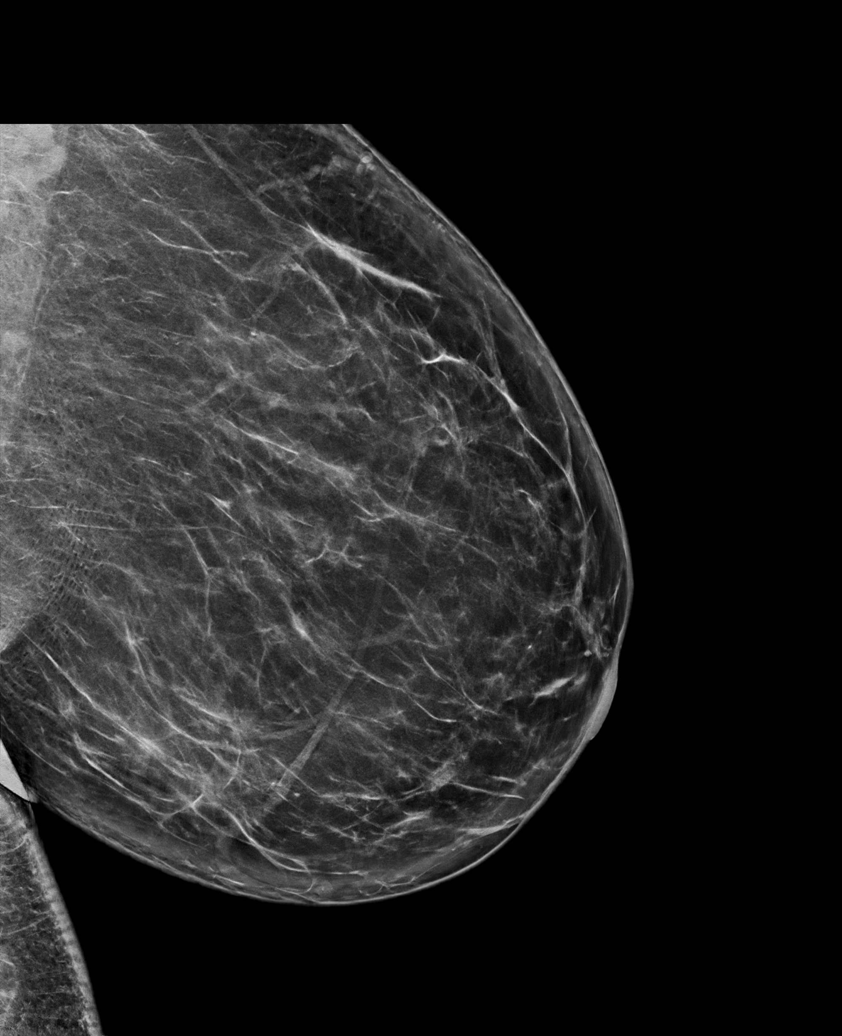

[L ML tomo · tomo slice 46/91.0]
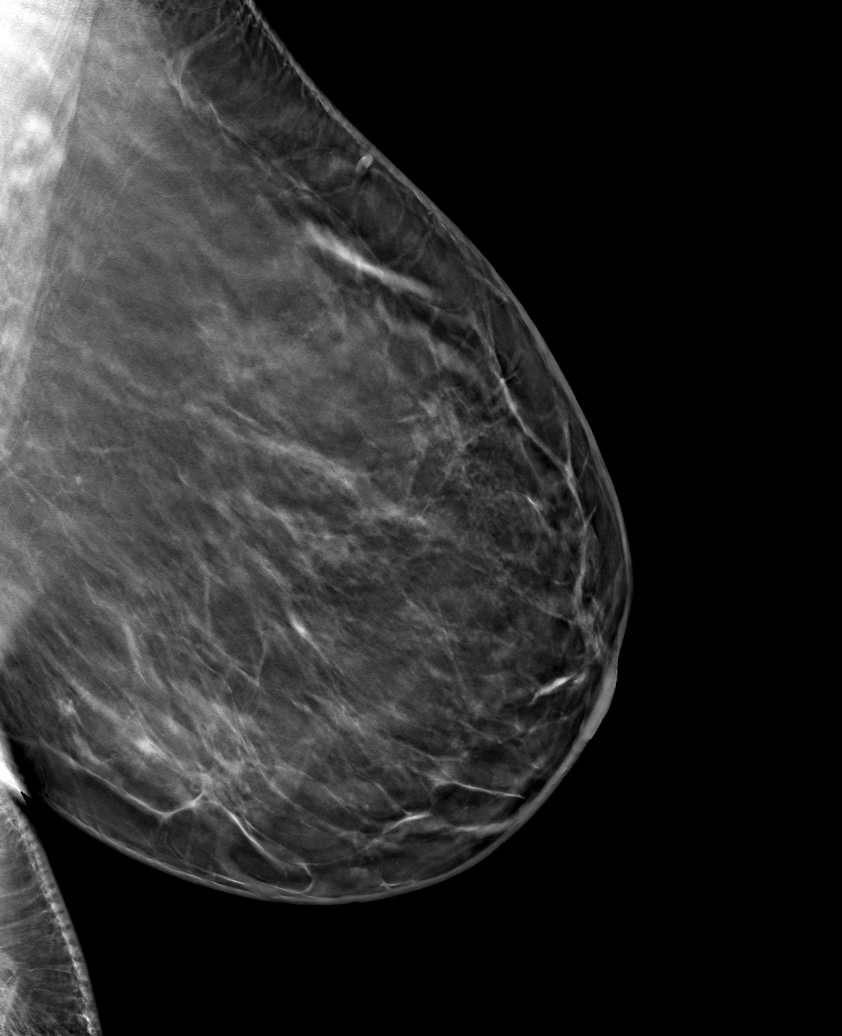

[L CC tomo · tomo slice 36/71.0]
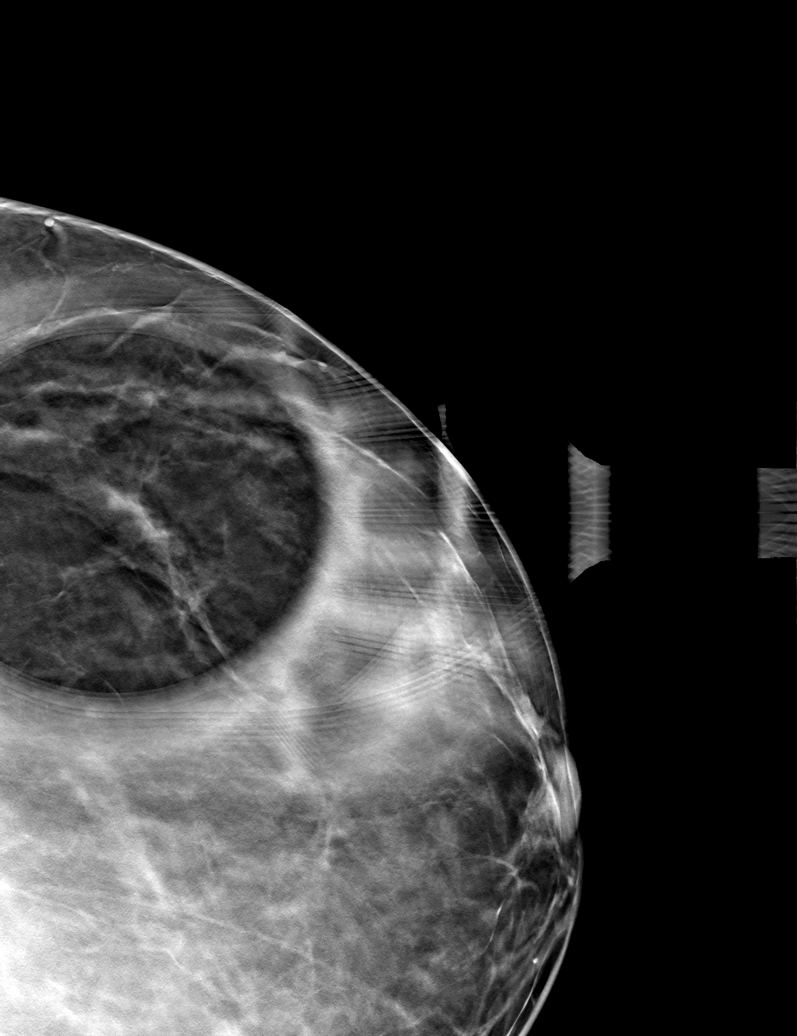

[6 of 14 positions shown; findings below may reference images not displayed]

ACR Breast Density Category b: There are scattered areas of
fibroglandular density.
FINDINGS: Focal spot compression view of the left breast in the CC projection
and a 90 degree lateral view of the left breast with tomography are
performed. There is no mass or architectural distortion. Normal
fibroglandular tissue is imaged and accounted for the possible
asymmetry seen at screening mammography. No findings to suggest
malignancy.

Mammographic images were processed with CAD.
IMPRESSION: No evidence of malignancy in left breast.

RECOMMENDATION:
Screening mammogram in one year.(Code:[id number]-4L2)

I have discussed the findings and recommendations with the patient.
Results were also provided in writing at the conclusion of the
visit. If applicable, a reminder letter will be sent to the patient
regarding the next appointment.

BI-RADS CATEGORY  1: Negative.

## 2018-03-18 ENCOUNTER — Ambulatory Visit (INDEPENDENT_AMBULATORY_CARE_PROVIDER_SITE_OTHER): Payer: Managed Care, Other (non HMO) | Admitting: Physician Assistant

## 2018-03-18 ENCOUNTER — Encounter: Payer: Self-pay | Admitting: Physician Assistant

## 2018-03-18 VITALS — BP 126/80 | HR 60 | Temp 98.1°F | Resp 16 | Ht 64.0 in | Wt 219.0 lb

## 2018-03-18 DIAGNOSIS — M169 Osteoarthritis of hip, unspecified: Secondary | ICD-10-CM | POA: Insufficient documentation

## 2018-03-18 DIAGNOSIS — S61012A Laceration without foreign body of left thumb without damage to nail, initial encounter: Secondary | ICD-10-CM | POA: Diagnosis not present

## 2018-03-18 DIAGNOSIS — F53 Postpartum depression: Secondary | ICD-10-CM | POA: Insufficient documentation

## 2018-03-18 DIAGNOSIS — O99345 Other mental disorders complicating the puerperium: Secondary | ICD-10-CM

## 2018-03-18 DIAGNOSIS — N920 Excessive and frequent menstruation with regular cycle: Secondary | ICD-10-CM | POA: Insufficient documentation

## 2018-03-18 DIAGNOSIS — M654 Radial styloid tenosynovitis [de Quervain]: Secondary | ICD-10-CM | POA: Insufficient documentation

## 2018-03-18 MED ORDER — CEPHALEXIN 500 MG PO CAPS: 500.0000 mg | ORAL_CAPSULE | Freq: Two times a day (BID) | ORAL | 0 refills | Status: DC

## 2018-03-18 NOTE — Patient Instructions (Signed)

## 2018-03-18 NOTE — Progress Notes (Signed)
Patient: Andrea Hayden Female    DOB: December 22, 1971   46 y.o.   MRN: [id number] Visit Date: 03/18/2018  Today's Provider: Margaretann Loveless, PA-C   Chief Complaint  Patient presents with  . Finger Injury   Subjective:    HPI Patient here today C/O left thumb injury last night with grater at home. Patient reports grater cut a "good chunk" off. Patient reports bleeding last for a few minutes and reports she had to change her gauze. Patient reports she took Tylenol last night for pain. Patient reports she did wash and clean area and added antibacterial cream.      No Known Allergies   Current Outpatient Medications:  .  acetaminophen (TYLENOL) 325 MG tablet, Take 650 mg by mouth daily as needed for pain. Reported on 06/18/2016, Disp: , Rfl:  .  BAYER CONTOUR NEXT TEST test strip, Reported on 06/18/2016, Disp: , Rfl: 0 .  escitalopram (LEXAPRO) 10 MG tablet, Take 10 mg by mouth., Disp: , Rfl:  .  hydrochlorothiazide (HYDRODIURIL) 25 MG tablet, Take 1 tablet by mouth daily., Disp: , Rfl:  .  Insulin Human (INSULIN PUMP) 100 unit/ml SOLN, Inject 1 each into the skin See admin instructions. Insulin pump, Novolog insulin, Disp: , Rfl:  .  metoprolol tartrate (LOPRESSOR) 25 MG tablet, Take 12.5 mg by mouth 2 (two) times daily., Disp: , Rfl:  .  NOVOLOG 100 UNIT/ML injection, , Disp: , Rfl: 10 .  omega-3 fish oil (MAXEPA) 1000 MG CAPS capsule, Take by mouth., Disp: , Rfl:   Review of Systems  Constitutional: Negative.   HENT: Negative.   Respiratory: Negative.   Cardiovascular: Negative.   Skin: Positive for wound.    Social History   Tobacco Use  . Smoking status: Never Smoker  . Smokeless tobacco: Never Used  Substance Use Topics  . Alcohol use: No   Objective:   BP 126/80 (BP Location: Left Arm, Patient Position: Sitting, Cuff Size: Large)   Pulse 60   Temp 98.1 F (36.7 C) (Oral)   Resp 16   Ht 5\' 4"  (1.626 m)   Wt 219 lb (99.3 kg)   LMP 01/20/2017   SpO2 98%    BMI 37.59 kg/m  Vitals:   03/18/18 1003  BP: 126/80  Pulse: 60  Resp: 16  Temp: 98.1 F (36.7 C)  TempSrc: Oral  SpO2: 98%  Weight: 219 lb (99.3 kg)  Height: 5\' 4"  (1.626 m)     Physical Exam  Constitutional: She appears well-developed and well-nourished. No distress.  Neck: Normal range of motion. Neck supple. No thyromegaly present.  Cardiovascular: Normal rate, regular rhythm and normal heart sounds. Exam reveals no gallop and no friction rub.  No murmur heard. Pulmonary/Chest: Effort normal and breath sounds normal. No respiratory distress. She has no wheezes. She has no rales.  Skin: Laceration noted. She is not diaphoretic.     Vitals reviewed.       Assessment & Plan:     1. Laceration of left thumb without foreign body without damage to nail, initial encounter Greater than 40 minutes was spent with patient today to obtain hemostasis and wound care. Pressure was held for 15 minutes to slow bleeding and then hemostasis was obtained with one silver nitrate stick. Wound was cleaned and dried. Vaseline gauze was applied to wound then covered with telfa pad and gauze to pad for protection against hitting objects. Secured with tape. Instructed patient to leave dressing  today and remove in 24 hours. Wash wound with soap and water daily. 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 Call if symptoms worsen or if bleeding starts again.  - cephALEXin (KEFLEX) 500 MG capsule; Take 1 capsule (500 mg total) by mouth 2 (two) times daily.  Dispense: 10 capsule; Refill: 0       Margaretann LovelessJennifer M Mystie Ormand, PA-C  [facility]

## 2018-04-01 LAB — HM DIABETES EYE EXAM

## 2018-05-16 ENCOUNTER — Encounter: Payer: Self-pay | Admitting: Physician Assistant

## 2018-05-16 ENCOUNTER — Ambulatory Visit (INDEPENDENT_AMBULATORY_CARE_PROVIDER_SITE_OTHER): Payer: Managed Care, Other (non HMO) | Admitting: Physician Assistant

## 2018-05-16 VITALS — BP 104/62 | HR 66 | Temp 97.9°F | Resp 16 | Wt 214.0 lb

## 2018-05-16 DIAGNOSIS — R3915 Urgency of urination: Secondary | ICD-10-CM

## 2018-05-16 DIAGNOSIS — R509 Fever, unspecified: Secondary | ICD-10-CM | POA: Diagnosis not present

## 2018-05-16 DIAGNOSIS — R9389 Abnormal findings on diagnostic imaging of other specified body structures: Secondary | ICD-10-CM | POA: Insufficient documentation

## 2018-05-16 DIAGNOSIS — R197 Diarrhea, unspecified: Secondary | ICD-10-CM

## 2018-05-16 LAB — POCT URINALYSIS DIPSTICK
APPEARANCE: NORMAL
Bilirubin, UA: NEGATIVE
Glucose, UA: NEGATIVE
KETONES UA: NEGATIVE
LEUKOCYTES UA: NEGATIVE
NITRITE UA: NEGATIVE
PH UA: 6 (ref 5.0–8.0)
PROTEIN UA: NEGATIVE
Spec Grav, UA: 1.025 (ref 1.010–1.025)
Urobilinogen, UA: 0.2 E.U./dL

## 2018-05-16 MED ORDER — SULFAMETHOXAZOLE-TRIMETHOPRIM 800-160 MG PO TABS: 1.0000 | ORAL_TABLET | Freq: Two times a day (BID) | ORAL | 0 refills | Status: DC

## 2018-05-16 NOTE — Patient Instructions (Signed)
Fever, Adult A fever is an increase in the body's temperature. It is often defined as a temperature of 100 F (38C) or higher. Short mild or moderate fevers often have no long-term effects. They also often do not need treatment. Moderate or high fevers may make you feel uncomfortable. Sometimes, they can also be a sign of a serious illness or disease. The sweating that may happen with repeated fevers or fevers that last a while may also cause you to not have enough fluid in your body (dehydration). You can take your temperature with a thermometer to see if you have a fever. A measured temperature can change with:  Age.  Time of day.  Where the thermometer is placed: ? Mouth (oral). ? Rectum (rectal). ? Ear (tympanic). ? Underarm (axillary). ? Forehead (temporal).  Follow these instructions at home: Pay attention to any changes in your symptoms. Take these actions to help with your condition:  Take over-the-counter and prescription medicines only as told by your doctor. Follow the dosing instructions carefully.  If you were prescribed an antibiotic medicine, take it as told by your doctor. Do not stop taking the antibiotic even if you start to feel better.  Rest as needed.  Drink enough fluid to keep your pee (urine) clear or pale yellow.  Sponge yourself or bathe with room-temperature water as needed. This helps to lower your body temperature . Do not use ice water.  Do not wear too many blankets or heavy clothes.  Contact a doctor if:  You throw up (vomit).  You cannot eat or drink without throwing up.  You have watery poop (diarrhea).  It hurts when you pee.  Your symptoms do not get better with treatment.  You have new symptoms.  You feel very weak. Get help right away if:  You are short of breath or have trouble breathing.  You are dizzy or you pass out (faint).  You feel confused.  You have signs of not having enough fluid in your body, such as: ? A dry  mouth. ? Peeing less. ? Looking pale.  You have very bad pain in your belly (abdomen).  You keep throwing up or having water poop.  You have a skin rash.  Your symptoms suddenly get worse. This information is not intended to replace advice given to you by your health care provider. Make sure you discuss any questions you have with your health care provider. Document Released: 09/22/2008 Document Revised: 05/21/2016 Document Reviewed: 02/07/2015 Elsevier Interactive Patient Education  2018 [employer] Inc[employer]  

## 2018-05-16 NOTE — Progress Notes (Signed)
Patient: Andrea Hayden Female    DOB: [birth year]   46 y.o.   MRN: [id number] Visit Date: 05/16/2018  Today's Provider: Margaretann Loveless, PA-C   Chief Complaint  Patient presents with  . Urinary Tract Infection    possibly    Subjective:    HPI Patient comes in today c/o lower back pain and body aches since yesterday. She also mentions that she feels a pressure in her bladder. She also mentions that she has urgency and frequency. She does mention that she had a low grade temp last night up to 101.8 degrees. Patient also c/o fatigue and nausea. She has only taken tylenol for her symptoms.     No Known Allergies   Current Outpatient Medications:  .  acetaminophen (TYLENOL) 325 MG tablet, Take 650 mg by mouth daily as needed for pain. Reported on 06/18/2016, Disp: , Rfl:  .  BAYER CONTOUR NEXT TEST test strip, Reported on 06/18/2016, Disp: , Rfl: 0 .  escitalopram (LEXAPRO) 10 MG tablet, Take 10 mg by mouth., Disp: , Rfl:  .  hydrochlorothiazide (HYDRODIURIL) 25 MG tablet, Take 1 tablet by mouth daily., Disp: , Rfl:  .  Insulin Human (INSULIN PUMP) 100 unit/ml SOLN, Inject 1 each into the skin See admin instructions. Insulin pump, Novolog insulin, Disp: , Rfl:  .  NOVOLOG 100 UNIT/ML injection, , Disp: , Rfl: 10 .  omega-3 fish oil (MAXEPA) 1000 MG CAPS capsule, Take by mouth., Disp: , Rfl:  .  metoprolol tartrate (LOPRESSOR) 25 MG tablet, Take 12.5 mg by mouth 2 (two) times daily., Disp: , Rfl:   Review of Systems  Constitutional: Positive for activity change, chills, fatigue and fever.       Body aches   Respiratory: Negative.   Cardiovascular: Negative.   Gastrointestinal: Positive for diarrhea and nausea.  Genitourinary: Positive for flank pain, frequency, pelvic pain and urgency. Negative for decreased urine volume, difficulty urinating, dysuria, hematuria, vaginal bleeding and vaginal discharge.  Musculoskeletal: Positive for back pain.    Social History    Tobacco Use  . Smoking status: Never Smoker  . Smokeless tobacco: Never Used  Substance Use Topics  . Alcohol use: No   Objective:   BP 104/62 (BP Location: Right Arm, Patient Position: Sitting, Cuff Size: Normal)   Pulse 66   Temp 97.9 F (36.6 C)   Resp 16   Wt 214 lb (97.1 kg)   LMP 01/20/2017   SpO2 99%   BMI 36.73 kg/m  Vitals:   05/16/18 1051  BP: 104/62  Pulse: 66  Resp: 16  Temp: 97.9 F (36.6 C)  SpO2: 99%  Weight: 214 lb (97.1 kg)     Physical Exam  Constitutional: She is oriented to person, place, and time. She appears well-developed and well-nourished. No distress.  Cardiovascular: Normal rate, regular rhythm and normal heart sounds. Exam reveals no gallop and no friction rub.  No murmur heard. Pulmonary/Chest: Effort normal and breath sounds normal. No respiratory distress. She has no wheezes. She has no rales.  Abdominal: Soft. Normal appearance and bowel sounds are normal. She exhibits no distension and no mass. There is no hepatosplenomegaly. There is tenderness in the right lower quadrant, suprapubic area and left lower quadrant. There is no rebound, no guarding and no CVA tenderness.  Neurological: She is alert and oriented to person, place, and time.  Skin: Skin is warm and dry. She is not diaphoretic.  Vitals reviewed.  Assessment & Plan:     1. Urinary urgency UA was normal. Will check labs and send for culture. DDx: most likely UTI vs colitis. Had diarrhea this morning (4 BM loose, watery). Will treat empirically with Bactrim as below. I will f/u pending results. Continue good hydration and treat fever/body aches with tylenol and IBU alternating prn. She is to call if symptoms worsen.  - POCT urinalysis dipstick - CBC w/Diff/Platelet - Basic Metabolic Panel (BMET) - Urine Culture - sulfamethoxazole-trimethoprim (BACTRIM DS,SEPTRA DS) 800-160 MG tablet; Take 1 tablet by mouth 2 (two) times daily.  Dispense: 20 tablet; Refill: 0  2.  Fever, unspecified fever cause See above medical treatment plan. - CBC w/Diff/Platelet - Basic Metabolic Panel (BMET) - Urine Culture - sulfamethoxazole-trimethoprim (BACTRIM DS,SEPTRA DS) 800-160 MG tablet; Take 1 tablet by mouth 2 (two) times daily.  Dispense: 20 tablet; Refill: 0  3. Diarrhea, unspecified type See above medical treatment plan. - CBC w/Diff/Platelet - Basic Metabolic Panel (BMET) - sulfamethoxazole-trimethoprim (BACTRIM DS,SEPTRA DS) 800-160 MG tablet; Take 1 tablet by mouth 2 (two) times daily.  Dispense: 20 tablet; Refill: 0       Margaretann Loveless, PA-C  [facility]

## 2018-05-17 ENCOUNTER — Telehealth: Payer: Self-pay

## 2018-05-17 LAB — CBC WITH DIFFERENTIAL/PLATELET
Basophils Absolute: 0 10*3/uL (ref 0.0–0.2)
Basos: 0 %
EOS (ABSOLUTE): 0 10*3/uL (ref 0.0–0.4)
Eos: 0 %
Hematocrit: 42 % (ref 34.0–46.6)
Hemoglobin: 13.6 g/dL (ref 11.1–15.9)
IMMATURE GRANS (ABS): 0 10*3/uL (ref 0.0–0.1)
IMMATURE GRANULOCYTES: 0 %
LYMPHS: 21 %
Lymphocytes Absolute: 0.9 10*3/uL (ref 0.7–3.1)
MCH: 29.2 pg (ref 26.6–33.0)
MCHC: 32.4 g/dL (ref 31.5–35.7)
MCV: 90 fL (ref 79–97)
MONOS ABS: 0.3 10*3/uL (ref 0.1–0.9)
Monocytes: 6 %
NEUTROS PCT: 73 %
Neutrophils Absolute: 3.2 10*3/uL (ref 1.4–7.0)
PLATELETS: 251 10*3/uL (ref 150–450)
RBC: 4.65 x10E6/uL (ref 3.77–5.28)
RDW: 13.6 % (ref 12.3–15.4)
WBC: 4.5 10*3/uL (ref 3.4–10.8)

## 2018-05-17 LAB — BASIC METABOLIC PANEL
BUN / CREAT RATIO: 10 (ref 9–23)
BUN: 8 mg/dL (ref 6–24)
CALCIUM: 8.6 mg/dL — AB (ref 8.7–10.2)
CHLORIDE: 96 mmol/L (ref 96–106)
CO2: 24 mmol/L (ref 20–29)
Creatinine, Ser: 0.8 mg/dL (ref 0.57–1.00)
GFR calc non Af Amer: 89 mL/min/{1.73_m2} (ref >59)
GFR, EST AFRICAN AMERICAN: 103 mL/min/{1.73_m2} (ref >59)
GLUCOSE: 188 mg/dL — AB (ref 65–99)
POTASSIUM: 3.6 mmol/L (ref 3.5–5.2)
Sodium: 135 mmol/L (ref 134–144)

## 2018-05-17 NOTE — Telephone Encounter (Signed)
-----   Message from Margaretann Loveless, PA-C sent at 05/17/2018  8:52 AM EDT ----- Labs are unremarkable. Awaiting culture.

## 2018-05-17 NOTE — Telephone Encounter (Signed)
Patient advised as below.  

## 2018-05-18 LAB — URINE CULTURE

## 2018-05-19 ENCOUNTER — Encounter: Payer: Self-pay | Admitting: Physician Assistant

## 2018-05-19 ENCOUNTER — Telehealth: Payer: Self-pay

## 2018-05-19 NOTE — Telephone Encounter (Signed)
Viewed by Jorge Ny on 05/17/2018 11:40 PM

## 2018-05-19 NOTE — Telephone Encounter (Signed)
-----   Message from Margaretann Loveless, PA-C sent at 05/18/2018  3:35 PM EDT ----- Urine culture is negative. How are your symptoms? Are they improving with the antibiotic?

## 2019-03-09 ENCOUNTER — Other Ambulatory Visit: Payer: Self-pay

## 2019-03-09 ENCOUNTER — Ambulatory Visit (INDEPENDENT_AMBULATORY_CARE_PROVIDER_SITE_OTHER): Payer: Managed Care, Other (non HMO) | Admitting: Physician Assistant

## 2019-03-09 ENCOUNTER — Encounter: Payer: Self-pay | Admitting: Physician Assistant

## 2019-03-09 VITALS — BP 151/90 | HR 71 | Temp 98.6°F | Resp 16 | Wt 217.8 lb

## 2019-03-09 DIAGNOSIS — R0981 Nasal congestion: Secondary | ICD-10-CM

## 2019-03-09 NOTE — Patient Instructions (Signed)
Coricidin: decongestants for people with high blood pressure  Xyzal: allergy medication Flonase: 2 sprays each nostril daily  If not better in 5 days please call us back for an antibiotics.    Sinusitis, Adult Sinusitis is soreness and swelling (inflammation) of your sinuses. Sinuses are hollow spaces in the bones around your face. They are located:  Around your eyes.  In the middle of your forehead.  Behind your nose.  In your cheekbones. Your sinuses and nasal passages are lined with a fluid called mucus. Mucus drains out of your sinuses. Swelling can trap mucus in your sinuses. This lets germs (bacteria, virus, or fungus) grow, which leads to infection. Most of the time, this condition is caused by a virus. What are the causes? This condition is caused by:  Allergies.  Asthma.  Germs.  Things that block your nose or sinuses.  Growths in the nose (nasal polyps).  Chemicals or irritants in the air.  Fungus (rare). What increases the risk? You are more likely to develop this condition if:  You have a weak body defense system (immune system).  You do a lot of swimming or diving.  You use nasal sprays too much.  You smoke. What are the signs or symptoms? The main symptoms of this condition are pain and a feeling of pressure around the sinuses. Other symptoms include:  Stuffy nose (congestion).  Runny nose (drainage).  Swelling and warmth in the sinuses.  Headache.  Toothache.  A cough that may get worse at night.  Mucus that collects in the throat or the back of the nose (postnasal drip).  Being unable to smell and taste.  Being very tired (fatigue).  A fever.  Sore throat.  Bad breath. How is this diagnosed? This condition is diagnosed based on:  Your symptoms.  Your medical history.  A physical exam.  Tests to find out if your condition is short-term (acute) or long-term (chronic). Your doctor may: ? Check your nose for growths (polyps).  ? Check your sinuses using a tool that has a light (endoscope). ? Check for allergies or germs. ? Do imaging tests, such as an MRI or CT scan. How is this treated? Treatment for this condition depends on the cause and whether it is short-term or long-term.  If caused by a virus, your symptoms should go away on their own within 10 days. You may be given medicines to relieve symptoms. They include: ? Medicines that shrink swollen tissue in the nose. ? Medicines that treat allergies (antihistamines). ? A spray that treats swelling of the nostrils. ? Rinses that help get rid of thick mucus in your nose (nasal saline washes).  If caused by bacteria, your doctor may wait to see if you will get better without treatment. You may be given antibiotic medicine if you have: ? A very bad infection. ? A weak body defense system.  If caused by growths in the nose, you may need to have surgery. Follow these instructions at home: Medicines  Take, use, or apply over-the-counter and prescription medicines only as told by your doctor. These may include nasal sprays.  If you were prescribed an antibiotic medicine, take it as told by your doctor. Do not stop taking the antibiotic even if you start to feel better. Hydrate and humidify   Drink enough water to keep your pee (urine) pale yellow.  Use a cool mist humidifier to keep the humidity level in your home above 50%.  Breathe in steam for 10-15  minutes, 3-4 times a day, or as told by your doctor. You can do this in the bathroom while a hot shower is running.  Try not to spend time in cool or dry air. Rest  Rest as much as you can.  Sleep with your head raised (elevated).  Make sure you get enough sleep each night. General instructions   Put a warm, moist washcloth on your face 3-4 times a day, or as often as told by your doctor. This will help with discomfort.  Wash your hands often with soap and water. If there is no soap and water, use  hand sanitizer.  Do not smoke. Avoid being around people who are smoking (secondhand smoke).  Keep all follow-up visits as told by your doctor. This is important. Contact a doctor if:  You have a fever.  Your symptoms get worse.  Your symptoms do not get better within 10 days. Get help right away if:  You have a very bad headache.  You cannot stop throwing up (vomiting).  You have very bad pain or swelling around your face or eyes.  You have trouble seeing.  You feel confused.  Your neck is stiff.  You have trouble breathing. Summary  Sinusitis is swelling of your sinuses. Sinuses are hollow spaces in the bones around your face.  This condition is caused by tissues in your nose that become inflamed or swollen. This traps germs. These can lead to infection.  If you were prescribed an antibiotic medicine, take it as told by your doctor. Do not stop taking it even if you start to feel better.  Keep all follow-up visits as told by your doctor. This is important. This information is not intended to replace advice given to you by your health care provider. Make sure you discuss any questions you have with your health care provider. Document Released: 06/01/2008 Document Revised: 05/16/2018 Document Reviewed: 05/16/2018 Elsevier Interactive Patient Education  2019 [employer].

## 2019-03-09 NOTE — Progress Notes (Signed)
Patient: Andrea Hayden Female    DOB: May 06, 1971   48 y.o.   MRN: [id number] Visit Date: 03/09/2019  Today's Provider: Trey Sailors, PA-C   Chief Complaint  Patient presents with  . URI   Subjective:     URI   This is a new problem. The current episode started in the past 7 days (started on Monday). The problem has been gradually worsening. There has been no fever. Associated symptoms include congestion, coughing, ear pain, headaches, rhinorrhea, sinus pain and sneezing. Pertinent negatives include no chest pain, sore throat or wheezing. She has tried increased fluids for the symptoms.    No Known Allergies   Current Outpatient Medications:  .  BAYER CONTOUR NEXT TEST test strip, Reported on 06/18/2016, Disp: , Rfl: 0 .  Insulin Human (INSULIN PUMP) 100 unit/ml SOLN, Inject 1 each into the skin See admin instructions. Insulin pump, Novolog insulin, Disp: , Rfl:  .  NOVOLOG 100 UNIT/ML injection, , Disp: , Rfl: 10 .  omega-3 fish oil (MAXEPA) 1000 MG CAPS capsule, Take by mouth., Disp: , Rfl:  .  escitalopram (LEXAPRO) 10 MG tablet, Take 10 mg by mouth., Disp: , Rfl:  .  hydrochlorothiazide (HYDRODIURIL) 25 MG tablet, Take 1 tablet by mouth daily., Disp: , Rfl:  .  metoprolol tartrate (LOPRESSOR) 25 MG tablet, Take 12.5 mg by mouth 2 (two) times daily., Disp: , Rfl:   Review of Systems  Constitutional: Positive for fatigue.  HENT: Positive for congestion, ear pain, postnasal drip, rhinorrhea, sinus pressure, sinus pain and sneezing. Negative for sore throat.   Respiratory: Positive for cough. Negative for chest tightness, shortness of breath and wheezing.   Cardiovascular: Negative for chest pain, palpitations and leg swelling.  Neurological: Positive for headaches.    Social History   Tobacco Use  . Smoking status: Never Smoker  . Smokeless tobacco: Never Used  Substance Use Topics  . Alcohol use: No      Objective:   BP (!) 151/90 (BP Location: Left Arm,  Patient Position: Sitting, Cuff Size: Large)   Pulse 71   Temp 98.6 F (37 C) (Oral)   Resp 16   Wt 217 lb 12.8 oz (98.8 kg)   LMP 01/20/2017   SpO2 98%   BMI 37.39 kg/m  Vitals:   03/09/19 1116  BP: (!) 151/90  Pulse: 71  Resp: 16  Temp: 98.6 F (37 C)  TempSrc: Oral  SpO2: 98%  Weight: 217 lb 12.8 oz (98.8 kg)     Physical Exam Constitutional:      Appearance: Normal appearance.  HENT:     Head:     Comments: Right Tm opaque, left TM with serous effusion.     Right Ear: Ear canal normal.     Left Ear: Ear canal normal.     Nose:     Right Sinus: No maxillary sinus tenderness or frontal sinus tenderness.     Left Sinus: No maxillary sinus tenderness or frontal sinus tenderness.     Mouth/Throat:     Mouth: Mucous membranes are moist.     Pharynx: Oropharynx is clear.  Neck:     Musculoskeletal: Neck supple.  Cardiovascular:     Rate and Rhythm: Normal rate and regular rhythm.     Pulses: Normal pulses.     Heart sounds: Normal heart sounds.  Pulmonary:     Effort: Pulmonary effort is normal.     Breath sounds: Normal breath  sounds.  Lymphadenopathy:     Cervical: No cervical adenopathy.  Skin:    General: Skin is warm and dry.  Neurological:     Mental Status: She is alert and oriented to person, place, and time. Mental status is at baseline.  Psychiatric:        Mood and Affect: Mood normal.        Behavior: Behavior normal.         Assessment & Plan    1. Sinus congestion  Counseled on using allergy medication including coricidin, xyzal, and flonase. May call back for abx in 5 days if worsening.   The entirety of the information documented in the History of Present Illness, Review of Systems and Physical Exam were personally obtained by me. Portions of this information were initially documented by Hetty Ely, CMA and reviewed by me for thoroughness and accuracy.   Return if symptoms worsen or fail to improve.       Trey Sailors, PA-C   [facility]

## 2019-03-13 ENCOUNTER — Telehealth: Payer: Self-pay

## 2019-03-13 MED ORDER — AMOXICILLIN-POT CLAVULANATE 875-125 MG PO TABS: 1.0000 | ORAL_TABLET | Freq: Two times a day (BID) | ORAL | 0 refills | Status: DC

## 2019-03-13 NOTE — Telephone Encounter (Signed)
Augmentin sent in.  

## 2019-03-13 NOTE — Addendum Note (Signed)
Addended by: Margaretann Loveless on: 03/13/2019 06:51 PM   Modules accepted: Orders

## 2019-03-13 NOTE — Telephone Encounter (Signed)
Patient continues with head congestions and feels it is a sinus infection.  Would like to know what else to do.

## 2019-03-13 NOTE — Telephone Encounter (Signed)
Patient reports that she is having body ache, fatigue, blowing out yellow mucus and has a lot of sinus pain and pressure. Patient is requesting an antibiotic. Please advise.

## 2019-03-13 NOTE — Telephone Encounter (Signed)
Patient advised as below.  

## 2019-03-13 NOTE — Telephone Encounter (Signed)
Flonase, nasal saline washes and an allergy medication (claritin, zyrtec, allegra, etc). Can take a week to start working. Call if still worsening.

## 2019-09-09 ENCOUNTER — Ambulatory Visit
Admission: EM | Admit: 2019-09-09 | Discharge: 2019-09-09 | Disposition: A | Discharge status: Home / Self Care | Payer: Managed Care, Other (non HMO) | Attending: Emergency Medicine | Admitting: Emergency Medicine

## 2019-09-09 ENCOUNTER — Other Ambulatory Visit: Payer: Self-pay

## 2019-09-09 ENCOUNTER — Encounter: Payer: Self-pay | Admitting: Emergency Medicine

## 2019-09-09 ENCOUNTER — Ambulatory Visit (INDEPENDENT_AMBULATORY_CARE_PROVIDER_SITE_OTHER): Payer: Managed Care, Other (non HMO)

## 2019-09-09 DIAGNOSIS — S40022A Contusion of left upper arm, initial encounter: Secondary | ICD-10-CM

## 2019-09-09 DIAGNOSIS — M79602 Pain in left arm: Secondary | ICD-10-CM

## 2019-09-09 DIAGNOSIS — M79672 Pain in left foot: Secondary | ICD-10-CM

## 2019-09-09 DIAGNOSIS — S93602A Unspecified sprain of left foot, initial encounter: Secondary | ICD-10-CM

## 2019-09-09 DIAGNOSIS — W19XXXA Unspecified fall, initial encounter: Secondary | ICD-10-CM | POA: Diagnosis not present

## 2019-09-09 MED ORDER — IBUPROFEN 800 MG PO TABS
800.0000 mg | ORAL_TABLET | Freq: Once | ORAL | Status: AC
  Administered 2019-09-09: 800 mg via ORAL

## 2019-09-09 NOTE — Discharge Instructions (Signed)
You were seen for a pain to your left arm and foot after a fall. Your Xray looked normal during the visit, so the pain is due to a sprain/contusion.  Treat the pain with over the counter tylenol and ibuprofen. Ice the area as needed.   Use over-the-counter triple antibiotic (Neosporin, etc.) to wound until the area begins to scab over. Keep covered as well.   Once area begins to scab, you don't have to use any ointments or treatments. Keep wound uncovered and dry during normal activities, but cover area if there's a chance it may get wet or dirty.    If the radiologist sees something in your Xray that we didn't see during your visit, we'll give you a call.

## 2019-09-09 NOTE — ED Triage Notes (Signed)
Patient states that she was playing freeze tag with her kids and tripped and fell on her left side last night.  Patient c/o left forearm pain and pain on the top of her left foot.

## 2019-09-09 NOTE — ED Provider Notes (Signed)
[facility] [place] [place]   Name: Andrea Hayden DOB: 11/10/1971 MRN: [id number] CSN: [id number] PCP: Margaretann Loveless, PA-C  Arrival date and time:  09/09/19 0349  Chief Complaint:  Fall, Arm Pain, and Foot Pain   NOTE: Prior to seeing the patient today, I have reviewed the triage nursing documentation and vital signs. Clinical staff has updated patient's PMH/PSHx, current medication list, and drug allergies/intolerances to ensure comprehensive history available to assist in medical decision making.   History:   HPI: Andrea Hayden is a 48 y.o. female who presents today with complaints of pain to left arm and foot after a fall yesterday evening. Pt was playing freeze tag with her children when she tripped over an exposed tree root and fell onto her left side. She denies hearing/feeling any cracks/pop with fall, but she mentioned she felt nausea and a sharp shooting pain to her left shoulder. No LOC with fall. She was assisted up by family members and treated the pain with extra strength tylenol OVN. Upon awakening this AM, the pain settled to her left forearm and the top of her left foot.   Currently rates the pain a 6/10, constant and aching. No tingling, swelling to extremities. No medications taken today for the pain.    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 Tenny Craw, MD;  Location: [facility];  Service: Obstetrics;  Laterality: N/A;   CHOLECYSTECTOMY  2010   DILATION AND CURETTAGE OF UTERUS     hysterectomy  09/21/2017   [facility]   Sleep Study  04/14/2015   Home sleep study showing no desaturations.    TUBAL LIGATION      Family History  Problem Relation Age of Onset   Hypertension Mother    Hyperlipidemia Mother    Hypertension Father    Hypertension Paternal Grandmother    Hyperlipidemia Paternal Grandmother    Cancer Paternal Grandmother        Breast cancer   Diabetes Paternal Grandmother    Hypertension Paternal Grandfather    Cancer Paternal Grandfather        prostate    Social History   Tobacco Use   Smoking status: Never Smoker   Smokeless tobacco: Never Used  Substance Use Topics   Alcohol use: No   Drug use: No    Patient Active Problem List   Diagnosis Date Noted   Thickened endometrium 05/16/2018   Menorrhagia 03/18/2018   Osteoarthritis of hip 03/18/2018   Postpartum depression 03/18/2018   Radial styloid tenosynovitis 03/18/2018   PVC (premature ventricular contraction) 08/24/2017   History of 2 cesarean sections 08/18/2017   Placenta accreta in third trimester 08/18/2017   Family history of cardiac arrhythmia 07/12/2017   Type 1 diabetes mellitus in pregnancy  05/28/2017   Chronic hypertension affecting pregnancy 05/28/2017   Hx of preeclampsia, prior pregnancy, currently pregnant, second trimester 05/28/2017   Advanced maternal age in multigravida, second trimester 05/28/2017   Hx of postpartum depression, currently pregnant 05/28/2017   DDD (degenerative disc disease), cervical 09/07/2016   Abnormal CAT scan 06/17/2016   Dysfunction of eustachian tube 06/17/2016   Arthralgia of hip 06/17/2016   BP (high blood pressure) 06/17/2016   Disordered sleep 06/17/2016   Adult BMI 30+ 11/09/2014   Accumulation of fluid in  tissues 03/05/2014   Hypercholesterolemia 08/05/2013   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 Insulin pump, Novolog insulin   NOVOLOG 100 UNIT/ML injection     Allergies:   Patient has no known allergies.  Review of Systems (ROS): Review of Systems  Musculoskeletal: Positive for arthralgias, gait problem and myalgias. Negative for back pain and joint swelling.  Skin: Positive for wound.  Neurological: Negative for weakness and headaches.     Vital Signs: Today's Vitals   09/09/19 0935 09/09/19 0938 09/09/19 1022  BP:  (!) 177/84 (!) 147/76  Pulse:  73   Resp:  16   Temp:  98.1 F (36.7 C)   TempSrc:  Oral   SpO2:  99%   Weight: 220 lb (99.8 kg)    Height: 5\' 4"  (1.626 m)    PainSc: 6       Physical Exam: Physical Exam Vitals signs and nursing note reviewed.  Constitutional:      General: She is not in acute distress.    Appearance: She is well-developed.  HENT:     Head: Normocephalic and atraumatic.  Eyes:     Conjunctiva/sclera: Conjunctivae normal.  Neck:     Musculoskeletal: Neck supple.  Cardiovascular:     Rate and Rhythm: Normal rate and regular rhythm.     Heart sounds: No murmur.  Pulmonary:     Effort: Pulmonary effort is normal. No respiratory distress.     Breath sounds: Normal breath sounds.  Abdominal:     Palpations: Abdomen is soft.     Tenderness: There is no abdominal tenderness.  Musculoskeletal:     Left ankle: She exhibits normal range of motion, no swelling, no laceration and normal pulse. Tenderness.     Left forearm: She  exhibits tenderness.     Comments: Full ROM and strength to both extremities with and without resistance.   Tenderness to ulna of left forearm and 2nd metatarsal of left foot.  Skin:    General: Skin is warm and dry.     Findings: Abrasion and ecchymosis present.     Comments: Both on ulnar aspect of left forearm   Neurological:     Mental Status: She is alert.     Urgent Care Treatments / Results:   LABS: PLEASE NOTE: all labs that were ordered this encounter are listed, however only abnormal results are displayed. Labs Reviewed - No data to display  EKG: -None  RADIOLOGY: No results found.   NP reviewed films prior to discharge. No acute abnormalities seen.   PROCEDURES: Procedures  MEDICATIONS RECEIVED THIS  VISIT: Medications  ibuprofen (ADVIL) tablet 800 mg (800 mg Oral Given 09/09/19 1002)    PERTINENT CLINICAL COURSE NOTES/UPDATES:   Initial Impression / Assessment and Plan / Urgent Care Course:  Pertinent labs & imaging results that were available during my care of the patient were personally reviewed by me and considered in my medical decision making (see lab/imaging section of note for values and interpretations).  Andrea Hayden is a 48 y.o. female who presents to [facility] today with complaints of pain to left arm and foot after fall, diagnosed with left arm and foot contusion, and treated as such with the over the counter medications below. NP and patient reviewed discharge instructions below during visit.   Patient is well appearing overall in clinic today. She does not appear to be in any acute distress. Presenting symptoms (see HPI) and exam as documented above.   I have reviewed the follow up and strict return precautions for any new or worsening symptoms. Patient is aware of symptoms that would be deemed urgent/emergent, and would thus require further evaluation either here or in the emergency department. At the time of discharge, she verbalized  understanding and consent with the discharge plan as it was reviewed with her. All questions were fielded by provider and/or clinic staff prior to patient discharge.    Final Clinical Impressions / Urgent Care Diagnoses:   Final diagnoses:  Fall, initial encounter  Contusion of left upper extremity, initial encounter  Sprain of left foot, initial encounter    New Prescriptions:  [place] Controlled Substance Registry consulted? Not Applicable  Meds ordered this encounter  Medications   ibuprofen (ADVIL) tablet 800 mg      Discharge Instructions     You were seen for a pain to your left arm and foot after a fall. Your Xray looked normal during the visit, so the pain is due to a sprain/contusion.  Treat the pain with over the counter tylenol and ibuprofen. Ice the area as needed.   Use over-the-counter triple antibiotic (Neosporin, etc.) to wound until the area begins to scab over. Keep covered as well.   Once area begins to scab, you don't have to use any ointments or treatments. Keep wound uncovered and dry during normal activities, but cover area if there's a chance it may get wet or dirty.    If the radiologist sees something in your Xray that we didn't see during your visit, we'll give you a call.      Recommended Follow up Care:  Patient encouraged to follow up with the following provider within the specified time frame, or sooner as dictated by the severity of her symptoms. As always, she was instructed that for any urgent/emergent care needs, she should seek care either here or in the emergency department for more immediate evaluation.   Gertie Baron, DNP, NP-c    Gertie Baron, NP 09/09/19 1030

## 2020-02-05 LAB — HEMOGLOBIN A1C: Hemoglobin A1C: 6.9

## 2020-02-14 ENCOUNTER — Ambulatory Visit (INDEPENDENT_AMBULATORY_CARE_PROVIDER_SITE_OTHER): Payer: Managed Care, Other (non HMO) | Admitting: Podiatry

## 2020-02-14 ENCOUNTER — Ambulatory Visit (INDEPENDENT_AMBULATORY_CARE_PROVIDER_SITE_OTHER): Payer: Managed Care, Other (non HMO)

## 2020-02-14 ENCOUNTER — Other Ambulatory Visit: Payer: Self-pay

## 2020-02-14 DIAGNOSIS — M722 Plantar fascial fibromatosis: Secondary | ICD-10-CM

## 2020-02-14 MED ORDER — MELOXICAM 15 MG PO TABS: 15.0000 mg | ORAL_TABLET | Freq: Every day | ORAL | 3 refills | Status: DC

## 2020-02-14 NOTE — Progress Notes (Signed)
She presents today with several month duration of left heel pain with burning centrally and laterally to some degree.  She is also having some numbness to the second and third toes of the left foot with odd sensations of hairs being wrapped around the toes.  Relates her last A1c was at 6.9.  Objective: Vital signs are stable she is alert and oriented x3 pulses are palpable.  Neurologic sensorium is diminished percent mostly monofilament deep tendon reflexes are intact muscle strength is normal symmetrical.  She has pain on palpation medial calcaneal tubercle central plantar calcaneal tubercle lateral calcaneal tubercle left.  Radiographs do demonstrate soft tissue increase in density at the plantar pressure calcaneal insertion site significant nature consistent with plantar fasciitis.  Assessment plantar fasciitis left.  Early diabetic peripheral neuropathy left.  Plan: Discussed etiology pathology, surgical therapies at this point I went ahead and offered her an injection but she declined.  Started her on meloxicam 15 mg 1 p.o. daily x30 and I will place her apply fascial brace and a night splint discussed appropriate shoe gear stretching exercise ice therapy shoe gear modifications I will follow-up with her in 1 month if not improved consider injection at that time.  Physical therapy will be her only other option short of surgery.  May need to consider orthotics with her as well.

## 2020-02-14 NOTE — Patient Instructions (Signed)

## 2020-02-28 ENCOUNTER — Other Ambulatory Visit: Payer: Self-pay

## 2020-02-28 ENCOUNTER — Encounter: Payer: Self-pay | Admitting: Adult Health

## 2020-02-28 ENCOUNTER — Ambulatory Visit (INDEPENDENT_AMBULATORY_CARE_PROVIDER_SITE_OTHER): Payer: Managed Care, Other (non HMO) | Admitting: Adult Health

## 2020-02-28 VITALS — BP 124/70 | HR 83 | Temp 96.9°F | Resp 16 | Wt 231.0 lb

## 2020-02-28 DIAGNOSIS — R350 Frequency of micturition: Secondary | ICD-10-CM | POA: Diagnosis not present

## 2020-02-28 DIAGNOSIS — K644 Residual hemorrhoidal skin tags: Secondary | ICD-10-CM

## 2020-02-28 DIAGNOSIS — Z9889 Other specified postprocedural states: Secondary | ICD-10-CM

## 2020-02-28 DIAGNOSIS — R1032 Left lower quadrant pain: Secondary | ICD-10-CM | POA: Insufficient documentation

## 2020-02-28 DIAGNOSIS — R5383 Other fatigue: Secondary | ICD-10-CM | POA: Insufficient documentation

## 2020-02-28 DIAGNOSIS — K5792 Diverticulitis of intestine, part unspecified, without perforation or abscess without bleeding: Secondary | ICD-10-CM | POA: Diagnosis not present

## 2020-02-28 DIAGNOSIS — R102 Pelvic and perineal pain: Secondary | ICD-10-CM | POA: Insufficient documentation

## 2020-02-28 LAB — POCT URINALYSIS DIPSTICK
Bilirubin, UA: NEGATIVE
Blood, UA: NEGATIVE
Glucose, UA: NEGATIVE
Ketones, UA: NEGATIVE
Leukocytes, UA: NEGATIVE
Nitrite, UA: NEGATIVE
Protein, UA: NEGATIVE
Spec Grav, UA: <=1.005 — AB (ref 1.010–1.025)
Urobilinogen, UA: 1 E.U./dL
pH, UA: 6.5 (ref 5.0–8.0)

## 2020-02-28 MED ORDER — SULFAMETHOXAZOLE-TRIMETHOPRIM 800-160 MG PO TABS: 1.0000 | ORAL_TABLET | Freq: Two times a day (BID) | ORAL | 0 refills | Status: DC

## 2020-02-28 NOTE — Progress Notes (Signed)
Patient: Andrea Hayden Female    DOB: February 10, 1971   49 y.o.   MRN: [id number] Visit Date: 02/28/2020  Today's Provider: Marcille Buffy, FNP   Chief Complaint  Patient presents with  . Abdominal Pain   Subjective:     Abdominal Pain This is a new problem. The current episode started in the past 7 days. The onset quality is sudden. The problem has been unchanged. The pain is located in the LLQ. The pain is at a severity of 4/10. The pain is moderate. The quality of the pain is cramping. The abdominal pain does not radiate. Associated symptoms include nausea. Pertinent negatives include no anorexia, arthralgias, belching, constipation, diarrhea, dysuria, fever, flatus, frequency, headaches, hematochezia, hematuria, melena, myalgias, vomiting or weight loss. Associated symptoms comments: Urinary frequency, pelvic pressure. . Nothing aggravates the pain.   She reports on Monday she started with left lower quadrant " pressure" cramping. Denies any fevers. Yesterday, Tuesday she felt better. This morning she got up and had the symptoms she had on Monday again. Denies any worsening symptoms. She has had urgency to urinate. Denies any dysuria, or odor. She describes the LLQ as having constant pressure. She denies any constipation.She has had a bowel movement today.  She has had loose stools but this is normal for her.Denies any blood in urine or in stool. She has not had any dark stools.  History cholecystomy. She has had occasional rectal pain into vaginal area.  Bladder repair after her last birth at [facility] after her hysterectomy uterus only ( she has both ovaries)  due to placenta previa.  OBGYN last year wanted her to  have pelvic floor therapy last year before Covid hit for her urinary leakage.  She still has occasional urinary leakage and sees her OBGYN the end of the month.    She denies any vaginal or rectal discharge. Denies any mucous in stools.   She reports she had this  once last year and it resolved. She was given Bactrim for suspected UTI - urine culture and possible diverticulitis. Urine returned normal at that time.   She is on Meloxicam  PRN for her plantar fascitis. Last time she took it was last Friday.   She is a insulin dependent diabetic who has a insulin pump. Follows with endocrinologist.   Patient  denies any fever, body aches,rash, chest pain, shortness of breath, nausea, vomiting, or diarrhea.    No Known Allergies   Current Outpatient Medications:  .  BAYER CONTOUR NEXT TEST test strip, Reported on 06/18/2016, Disp: , Rfl: 0 .  glucagon 1 MG injection, Glucagon Emergency Kit 1 mg solution for injection, Disp: , Rfl:  .  glucose blood (CONTOUR NEXT TEST) test strip, USE UP TO SIX TIMES DAILY. TESTING DUE TO PREGNANCY AND HISTORY OF HYPOGLYCEMIA, Disp: , Rfl:  .  insulin aspart (NOVOLOG) 100 UNIT/ML injection, Novolog U-100 Insulin aspart 100 unit/mL subcutaneous solution, Disp: , Rfl:  .  Insulin Human (INSULIN PUMP) 100 unit/ml SOLN, Inject 1 each into the skin See admin instructions. Insulin pump, Novolog insulin, Disp: , Rfl:  .  NOVOLOG 100 UNIT/ML injection, , Disp: , Rfl: 10 .  PRESCRIPTION MEDICATION, Cholestar-RF qd, Disp: , Rfl:  .  ammonium lactate (LAC-HYDRIN) 12 % lotion, ammonium lactate 12 % lotion, Disp: , Rfl:  .  atorvastatin (LIPITOR) 20 MG tablet, atorvastatin 20 mg tablet, Disp: , Rfl:  .  enoxaparin (LOVENOX) 40 MG/0.4ML injection, enoxaparin 40  mg/0.4 mL subcutaneous syringe, Disp: , Rfl:  .  escitalopram (LEXAPRO) 10 MG tablet, Take 10 mg by mouth., Disp: , Rfl:  .  escitalopram (LEXAPRO) 20 MG tablet, escitalopram 20 mg tablet, Disp: , Rfl:  .  fluticasone (FLONASE) 50 MCG/ACT nasal spray, fluticasone propionate 50 mcg/actuation nasal spray,suspension, Disp: , Rfl:  .  hydrochlorothiazide (HYDRODIURIL) 25 MG tablet, Take 1 tablet by mouth daily., Disp: , Rfl:  .  insulin lispro (HUMALOG) 100 UNIT/ML injection, Humalog  U-100 Insulin 100 unit/mL subcutaneous solution, Disp: , Rfl:  .  labetalol (NORMODYNE) 200 MG tablet, labetalol 200 mg tablet, Disp: , Rfl:  .  lisinopril (ZESTRIL) 5 MG tablet, lisinopril 5 mg tablet, Disp: , Rfl:  .  meloxicam (MOBIC) 15 MG tablet, Take 1 tablet (15 mg total) by mouth daily. (Patient not taking: Reported on 02/28/2020), Disp: 30 tablet, Rfl: 3 .  metoprolol tartrate (LOPRESSOR) 25 MG tablet, Take 12.5 mg by mouth 2 (two) times daily., Disp: , Rfl:  .  metoprolol tartrate (LOPRESSOR) 25 MG tablet, metoprolol tartrate 25 mg tablet, Disp: , Rfl:  .  NIFEdipine (PROCARDIA-XL/NIFEDICAL-XL) 30 MG 24 hr tablet, nifedipine ER 30 mg tablet,extended release 24 hr, Disp: , Rfl:  .  omega-3 fish oil (MAXEPA) 1000 MG CAPS capsule, Take by mouth., Disp: , Rfl:  .  pantoprazole (PROTONIX) 40 MG tablet, pantoprazole 40 mg tablet,delayed release, Disp: , Rfl:  .  rosuvastatin (CRESTOR) 5 MG tablet, Take by mouth., Disp: , Rfl:  .  sertraline (ZOLOFT) 100 MG tablet, sertraline 100 mg tablet  take 1 tablet by mouth once daily, Disp: , Rfl:   Review of Systems  Constitutional: Negative for fever and weight loss.  Gastrointestinal: Positive for abdominal pain and nausea. Negative for anorexia, constipation, diarrhea, flatus, hematochezia, melena and vomiting.  Genitourinary: Positive for flank pain, pelvic pain and urgency. Negative for decreased urine volume, difficulty urinating, dysuria, frequency, hematuria, vaginal bleeding, vaginal discharge and vaginal pain.  Musculoskeletal: Negative for arthralgias and myalgias.  Neurological: Negative for headaches.    Social History   Tobacco Use  . Smoking status: Never Smoker  . Smokeless tobacco: Never Used  Substance Use Topics  . Alcohol use: No      Objective:   BP 124/70   Pulse 83   Temp (!) 96.9 F (36.1 C) (Oral)   Resp 16   Wt 231 lb (104.8 kg)   LMP 01/20/2017   SpO2 98%   BMI 39.65 kg/m  Vitals:   02/28/20 1519  BP:  124/70  Pulse: 83  Resp: 16  Temp: (!) 96.9 F (36.1 C)  TempSrc: Oral  SpO2: 98%  Weight: 231 lb (104.8 kg)  Body mass index is 39.65 kg/m.   Physical Exam Vitals reviewed. Exam conducted with a chaperone present.  Constitutional:      General: She is not in acute distress.    Appearance: She is well-developed. She is obese. She is not ill-appearing, toxic-appearing or diaphoretic.  HENT:     Head: Normocephalic and atraumatic.  Eyes:     Extraocular Movements: Extraocular movements intact.     Pupils: Pupils are equal, round, and reactive to light.  Cardiovascular:     Rate and Rhythm: Normal rate and regular rhythm.     Heart sounds: Normal heart sounds. No murmur. No friction rub. No gallop.   Pulmonary:     Effort: Pulmonary effort is normal. No respiratory distress.     Breath sounds: Normal breath sounds. No  stridor. No wheezing, rhonchi or rales.  Chest:     Chest wall: No tenderness.  Abdominal:     General: Abdomen is protuberant. Bowel sounds are normal. There is no distension or abdominal bruit. There are no signs of injury.     Palpations: Abdomen is soft. There is no shifting dullness, fluid wave, hepatomegaly, splenomegaly, mass or pulsatile mass.     Tenderness: There is abdominal tenderness (moderate tenderness with palption of LLQ) in the suprapubic area and left lower quadrant. There is no right CVA tenderness, left CVA tenderness, guarding or rebound. Negative signs include Murphy's sign, Rovsing's sign, McBurney's sign, psoas sign and obturator sign.     Hernia: No hernia is present. There is no hernia in the umbilical area, ventral area, left inguinal area, right femoral area, left femoral area or right inguinal area.       Comments: Round, obese, limits exam   Genitourinary:    General: Normal vulva.     Exam position: Lithotomy position.     Pubic Area: No rash or pubic lice.      Tanner stage (genital): 5.     Labia:        Right: No rash,  tenderness, lesion or injury.        Left: No tenderness, lesion or injury.      Vagina: Normal. No signs of injury and foreign body. No vaginal discharge, erythema, tenderness, bleeding, lesions or prolapsed vaginal walls.     Uterus: Absent.      Adnexa: Right adnexa normal and left adnexa normal.       Right: No mass, tenderness or fullness.         Left: No mass, tenderness or fullness.       Rectum: Guaiac result negative (guiac was negative ). External hemorrhoid (2 external skin tags flesh color approximately 1cmx 1cm each from hemorrhoid . No bleeding/ or color change. ) present. No mass, tenderness, anal fissure or internal hemorrhoid. Normal anal tone.     Comments: No uterus. She does have ovaries from hysterectomy. However due to obesity/ round abdomen exam is limited.  Lymphadenopathy:     Lower Body: No right inguinal adenopathy. No left inguinal adenopathy.  Skin:    General: Skin is warm and dry.     Capillary Refill: Capillary refill takes less than 2 seconds.  Neurological:     General: No focal deficit present.     Mental Status: She is alert and oriented to person, place, and time.     Cranial Nerves: No cranial nerve deficit.     Motor: No weakness.  Psychiatric:        Mood and Affect: Mood normal. Mood is not anxious or depressed.        Behavior: Behavior normal.      Results for orders placed or performed in visit on 02/28/20  POCT urinalysis dipstick  Result Value Ref Range   Color, UA yellow    Clarity, UA clear    Glucose, UA Negative Negative   Bilirubin, UA negative    Ketones, UA negative    Spec Grav, UA <=1.005 (A) 1.010 - 1.025   Blood, UA negative    pH, UA 6.5 5.0 - 8.0   Protein, UA Negative Negative   Urobilinogen, UA 1.0 0.2 or 1.0 E.U./dL   Nitrite, UA negative    Leukocytes, UA Negative Negative   Appearance     Odor  Assessment & Plan     Left lower quadrant abdominal pain - Plan: CT Abdomen Pelvis Wo Contrast, CBC with  Differential/Platelet, Comprehensive Metabolic Panel (CMET), Sed Rate (ESR), C-reactive protein  Pelvic pressure in female - Plan: TSH  Urinary frequency - Plan: POCT urinalysis dipstick, Urine Culture  Diverticulitis  Fatigue, unspecified type - Plan: TSH  History of bladder repair surgery  Will CT scan abdomen since this is recurrent from one year ago and she feels pelvic pressure and LLQ tenderness, bloating at times. She has history or urinary leakage and frequency is new. With chills, and reports of afebrile with LLQ tenderness, loose stools although not unusual for her. Hemoccult was negative. Vaginal exam was negative, she does have skin tag in rectal area likely from residual from hemorrhoids given multiple gravida history.  Will start her on Bactrim as below and order CT of abdomen for further evaluation. Urine POCT was negative, will again send for culture to rule out any infection given symptoms. Discussed dietary precautions. Hemoccult was negative. She is aware that imaging will be calling her for CT scan.  Meds ordered this encounter  Medications  . sulfamethoxazole-trimethoprim (BACTRIM DS) 800-160 MG tablet    Sig: Take 1 tablet by mouth 2 (two) times daily.    Dispense:  20 tablet    Refill:  0   She is advised as well to follow up with gynecology regarding her recommended pelvic floor therapy she was to do around one year ago and has not started yet due to Covid pandemic and staying home.  Return in about 1 week (around 03/06/2020), or if symptoms worsen or fail to improve, for at any time for any worsening symptoms, Go to Emergency room/ urgent care if worse.   Advised patient call the office or your primary care doctor for an appointment if no improvement within 72 hours or if any symptoms change or worsen at any time  Advised ER or urgent Care if after hours or on weekend. Call 911 for emergency symptoms at any time.Patinet verbalized understanding of all instructions  given/reviewed and treatment plan and has no further questions or concerns at this time.       The entirety of the information documented in the History of Present Illness, Review of Systems and Physical Exam were personally obtained by me. Portions of this information were initially documented by the  Certified Medical Assistant whose name is documented in [place] and reviewed by me for thoroughness and accuracy.  I have personally performed the exam and reviewed the chart and it is accurate to the best of my knowledge.  Haematologist has been used and any errors in dictation or transcription are unintentional.  Kelby Aline. [place], [facility]

## 2020-02-28 NOTE — Patient Instructions (Signed)

## 2020-02-29 ENCOUNTER — Telehealth: Payer: Self-pay | Admitting: *Deleted

## 2020-02-29 ENCOUNTER — Encounter: Payer: Self-pay | Admitting: Adult Health

## 2020-02-29 DIAGNOSIS — K644 Residual hemorrhoidal skin tags: Secondary | ICD-10-CM | POA: Insufficient documentation

## 2020-02-29 LAB — CBC WITH DIFFERENTIAL/PLATELET
Basophils Absolute: 0 10*3/uL (ref 0.0–0.2)
Basos: 0 %
EOS (ABSOLUTE): 0.1 10*3/uL (ref 0.0–0.4)
Eos: 1 %
Hematocrit: 38.2 % (ref 34.0–46.6)
Hemoglobin: 13.4 g/dL (ref 11.1–15.9)
Immature Grans (Abs): 0 10*3/uL (ref 0.0–0.1)
Immature Granulocytes: 0 %
Lymphocytes Absolute: 1.2 10*3/uL (ref 0.7–3.1)
Lymphs: 13 %
MCH: 30.5 pg (ref 26.6–33.0)
MCHC: 35.1 g/dL (ref 31.5–35.7)
MCV: 87 fL (ref 79–97)
Monocytes Absolute: 0.6 10*3/uL (ref 0.1–0.9)
Monocytes: 7 %
Neutrophils Absolute: 7.1 10*3/uL — ABNORMAL HIGH (ref 1.4–7.0)
Neutrophils: 79 %
Platelets: 256 10*3/uL (ref 150–450)
RBC: 4.39 x10E6/uL (ref 3.77–5.28)
RDW: 12.1 % (ref 11.7–15.4)
WBC: 9.1 10*3/uL (ref 3.4–10.8)

## 2020-02-29 LAB — COMPREHENSIVE METABOLIC PANEL
ALT: 11 IU/L (ref 0–32)
AST: 15 IU/L (ref 0–40)
Albumin/Globulin Ratio: 1.6 (ref 1.2–2.2)
Albumin: 4.4 g/dL (ref 3.8–4.8)
Alkaline Phosphatase: 154 IU/L — ABNORMAL HIGH (ref 39–117)
BUN/Creatinine Ratio: 13 (ref 9–23)
BUN: 10 mg/dL (ref 6–24)
Bilirubin Total: 0.4 mg/dL (ref 0.0–1.2)
CO2: 26 mmol/L (ref 20–29)
Calcium: 9.2 mg/dL (ref 8.7–10.2)
Chloride: 101 mmol/L (ref 96–106)
Creatinine, Ser: 0.79 mg/dL (ref 0.57–1.00)
GFR calc Af Amer: 103 mL/min/{1.73_m2} (ref >59)
GFR calc non Af Amer: 89 mL/min/{1.73_m2} (ref >59)
Globulin, Total: 2.7 g/dL (ref 1.5–4.5)
Glucose: 62 mg/dL — ABNORMAL LOW (ref 65–99)
Potassium: 4.2 mmol/L (ref 3.5–5.2)
Sodium: 140 mmol/L (ref 134–144)
Total Protein: 7.1 g/dL (ref 6.0–8.5)

## 2020-02-29 LAB — SEDIMENTATION RATE: Sed Rate: 69 mm/hr — ABNORMAL HIGH (ref 0–32)

## 2020-02-29 LAB — C-REACTIVE PROTEIN: CRP: 53 mg/L — ABNORMAL HIGH (ref 0–10)

## 2020-02-29 LAB — TSH: TSH: 2.98 u[IU]/mL (ref 0.450–4.500)

## 2020-02-29 NOTE — Progress Notes (Signed)
  CBC ok no white blood cell increase, neutrophils just very slight increase, blood glucose was 62 in office, monitor and eat regular meals/ snacks. Thyroid normal lab.  ESR and CRP are elevated suspect inflammation with likely diverticulitis infection- take Bactrim as prescribed. Will recheck once no acute symptoms. Alkaline phosphatase liver enzyme increased however she was not fasting. Will recheck with CRP, ESR levels at follow up. Proceed with CT scan when called and if any symptoms worsening be seen immediately.  Urine culture still pending. Add fiber supplement Metamucil or Psyllium husk once daily per package instructions.

## 2020-02-29 NOTE — Telephone Encounter (Signed)
Pt notified of lab message, see TE 02/29/20.

## 2020-02-29 NOTE — Telephone Encounter (Signed)
-----   Message from Fonda Kinder, CMA sent at 02/29/2020  4:52 PM EST ----- Left message for patient to call back, okay for [facility] triage to advise if patient returns call. KW

## 2020-03-01 LAB — URINE CULTURE

## 2020-03-01 NOTE — Progress Notes (Signed)
Normal flora in urine. No urinary tract infection.

## 2020-03-06 ENCOUNTER — Telehealth: Payer: Self-pay

## 2020-03-06 NOTE — Telephone Encounter (Signed)
LMTCB, [facility] Triage Nurse may give patient results  Per note from visit, patient is to have labs done at the time of her next follow up.  Patient was advised to proceed with CT and keep follow up.      Copied from CRM [id number]. Topic: General - Other >> Mar 06, 2020 11:39 AM Gwenlyn Fudge wrote: Reason for CRM: Pt called stating that she was supposed to have repeat labs, but was not told of an exact date. Please advise.

## 2020-03-06 NOTE — Telephone Encounter (Signed)
Attempted to contact pt regarding the lab message. No answer, left message to return the call.

## 2020-03-06 NOTE — Telephone Encounter (Signed)
Patient returned call for information. Reviewed physician's note with the patient. Scheduled a follow-up visit with pcp for 03/09/20@ 7:00am. She has CT on 03/15/20@ 1:30p.

## 2020-03-07 NOTE — Telephone Encounter (Signed)
In regards to this message below is incorrect, I see that patient has a scheduled appt tomorrow with her PCP Antony Contras at [facility]. Lorain Childes- KW

## 2020-03-07 NOTE — Progress Notes (Signed)
Patient: Andrea Hayden Female    DOB: 1971/11/11   49 y.o.   MRN: [id number] Visit Date: 03/08/2020  Today's Provider: Mar Daring, PA-C   Chief Complaint  Patient presents with  . Follow-up   Subjective:     HPI  Patient here for a 1 week follow up abdominal pain, possible diverticulitis infection- treated with Bactrim. She also had Ct scan ordered and is scheduled for 03/19. She still has one more day with the Bactrim. Reports that she feels much better.   No Known Allergies   Current Outpatient Medications:  .  BAYER CONTOUR NEXT TEST test strip, Reported on 06/18/2016, Disp: , Rfl: 0 .  glucagon 1 MG injection, Glucagon Emergency Kit 1 mg solution for injection, Disp: , Rfl:  .  glucose blood (CONTOUR NEXT TEST) test strip, USE UP TO SIX TIMES DAILY. TESTING DUE TO PREGNANCY AND HISTORY OF HYPOGLYCEMIA, Disp: , Rfl:  .  insulin aspart (NOVOLOG) 100 UNIT/ML injection, Novolog U-100 Insulin aspart 100 unit/mL subcutaneous solution, Disp: , Rfl:  .  Insulin Human (INSULIN PUMP) 100 unit/ml SOLN, Inject 1 each into the skin See admin instructions. Insulin pump, Novolog insulin, Disp: , Rfl:  .  NOVOLOG 100 UNIT/ML injection, , Disp: , Rfl: 10 .  PRESCRIPTION MEDICATION, Cholestar-RF qd, Disp: , Rfl:  .  sulfamethoxazole-trimethoprim (BACTRIM DS) 800-160 MG tablet, Take 1 tablet by mouth 2 (two) times daily., Disp: 20 tablet, Rfl: 0 .  ammonium lactate (LAC-HYDRIN) 12 % lotion, ammonium lactate 12 % lotion, Disp: , Rfl:  .  atorvastatin (LIPITOR) 20 MG tablet, atorvastatin 20 mg tablet, Disp: , Rfl:  .  escitalopram (LEXAPRO) 10 MG tablet, Take 10 mg by mouth., Disp: , Rfl:  .  escitalopram (LEXAPRO) 20 MG tablet, escitalopram 20 mg tablet, Disp: , Rfl:  .  fluticasone (FLONASE) 50 MCG/ACT nasal spray, fluticasone propionate 50 mcg/actuation nasal spray,suspension, Disp: , Rfl:  .  hydrochlorothiazide (HYDRODIURIL) 25 MG tablet, Take 1 tablet by mouth daily., Disp:  , Rfl:  .  insulin lispro (HUMALOG) 100 UNIT/ML injection, Humalog U-100 Insulin 100 unit/mL subcutaneous solution, Disp: , Rfl:  .  labetalol (NORMODYNE) 200 MG tablet, labetalol 200 mg tablet, Disp: , Rfl:  .  lisinopril (ZESTRIL) 5 MG tablet, lisinopril 5 mg tablet, Disp: , Rfl:  .  meloxicam (MOBIC) 15 MG tablet, Take 1 tablet (15 mg total) by mouth daily. (Patient not taking: Reported on 02/28/2020), Disp: 30 tablet, Rfl: 3 .  metoprolol tartrate (LOPRESSOR) 25 MG tablet, Take 12.5 mg by mouth 2 (two) times daily., Disp: , Rfl:  .  metoprolol tartrate (LOPRESSOR) 25 MG tablet, metoprolol tartrate 25 mg tablet, Disp: , Rfl:  .  NIFEdipine (PROCARDIA-XL/NIFEDICAL-XL) 30 MG 24 hr tablet, nifedipine ER 30 mg tablet,extended release 24 hr, Disp: , Rfl:  .  omega-3 fish oil (MAXEPA) 1000 MG CAPS capsule, Take by mouth., Disp: , Rfl:  .  pantoprazole (PROTONIX) 40 MG tablet, pantoprazole 40 mg tablet,delayed release, Disp: , Rfl:  .  rosuvastatin (CRESTOR) 5 MG tablet, Take by mouth., Disp: , Rfl:  .  sertraline (ZOLOFT) 100 MG tablet, sertraline 100 mg tablet  take 1 tablet by mouth once daily, Disp: , Rfl:   Review of Systems  Constitutional: Negative.   Respiratory: Negative.   Cardiovascular: Negative.   Gastrointestinal: Negative.     Social History   Tobacco Use  . Smoking status: Never Smoker  . Smokeless tobacco: Never Used  Substance Use Topics  . Alcohol use: No      Objective:   BP (!) 142/85 (BP Location: Left Arm, Patient Position: Sitting, Cuff Size: Large)   Pulse 65   Temp (!) 97.2 F (36.2 C) (Temporal)   Resp 16   Wt 228 lb 3.2 oz (103.5 kg)   LMP 01/20/2017   BMI 39.17 kg/m  Vitals:   03/08/20 0707  BP: (!) 142/85  Pulse: 65  Resp: 16  Temp: (!) 97.2 F (36.2 C)  TempSrc: Temporal  Weight: 228 lb 3.2 oz (103.5 kg)  Body mass index is 39.17 kg/m.   Physical Exam Vitals reviewed.  Constitutional:      General: She is not in acute distress.     Appearance: Normal appearance. She is well-developed. She is obese. She is not ill-appearing.  HENT:     Head: Normocephalic and atraumatic.  Pulmonary:     Effort: Pulmonary effort is normal. No respiratory distress.  Musculoskeletal:     Cervical back: Normal range of motion and neck supple.  Neurological:     Mental Status: She is alert.  Psychiatric:        Mood and Affect: Mood normal.        Behavior: Behavior normal.        Thought Content: Thought content normal.        Judgment: Judgment normal.      No results found for any visits on 03/08/20.     Assessment & Plan    1. Diverticulitis Improved. Still has a few days of Bactrim left. Will check labs as below and f/u pending results. - CBC with Differential - Hepatic function panel - Sed Rate (ESR) - C-reactive protein  2. Class 2 severe obesity due to excess calories with serious comorbidity and body mass index (BMI) of 39.0 to 39.9 in adult [facility]) Trying to eat healthier options and work on weight loss.   3. Type 1 diabetes mellitus without complication ([place]) Followed by [facility], Dr. Milinda Pointer. Has f/u on 04/04/20.     Mar Daring, PA-C  [facility]

## 2020-03-08 ENCOUNTER — Ambulatory Visit (INDEPENDENT_AMBULATORY_CARE_PROVIDER_SITE_OTHER): Payer: Managed Care, Other (non HMO) | Admitting: Physician Assistant

## 2020-03-08 ENCOUNTER — Encounter: Payer: Self-pay | Admitting: Physician Assistant

## 2020-03-08 ENCOUNTER — Other Ambulatory Visit: Payer: Self-pay

## 2020-03-08 VITALS — BP 142/85 | HR 65 | Temp 97.2°F | Resp 16 | Wt 228.2 lb

## 2020-03-08 DIAGNOSIS — K5792 Diverticulitis of intestine, part unspecified, without perforation or abscess without bleeding: Secondary | ICD-10-CM | POA: Diagnosis not present

## 2020-03-08 DIAGNOSIS — E109 Type 1 diabetes mellitus without complications: Secondary | ICD-10-CM

## 2020-03-08 DIAGNOSIS — Z6839 Body mass index (BMI) 39.0-39.9, adult: Secondary | ICD-10-CM | POA: Diagnosis not present

## 2020-03-08 NOTE — Patient Instructions (Signed)
Diverticulitis  Diverticulitis is infection or inflammation of small pouches (diverticula) in the colon that form due to a condition called diverticulosis. Diverticula can trap stool (feces) and bacteria, causing infection and inflammation. Diverticulitis may cause severe stomach pain and diarrhea. It may lead to tissue damage in the colon that causes bleeding. The diverticula may also burst (rupture) and cause infected stool to enter other areas of the abdomen. Complications of diverticulitis can include:  Bleeding.  Severe infection.  Severe pain.  Rupture (perforation) of the colon.  Blockage (obstruction) of the colon. What are the causes? This condition is caused by stool becoming trapped in the diverticula, which allows bacteria to grow in the diverticula. This leads to inflammation and infection. What increases the risk? You are more likely to develop this condition if:  You have diverticulosis. The risk for diverticulosis increases if: ? You are overweight or obese. ? You use tobacco products. ? You do not get enough exercise.  You eat a diet that does not include enough fiber. High-fiber foods include fruits, vegetables, beans, nuts, and whole grains. What are the signs or symptoms? Symptoms of this condition may include:  Pain and tenderness in the abdomen. The pain is normally located on the left side of the abdomen, but it may occur in other areas.  Fever and chills.  Bloating.  Cramping.  Nausea.  Vomiting.  Changes in bowel routines.  Blood in your stool. How is this diagnosed? This condition is diagnosed based on:  Your medical history.  A physical exam.  Tests to make sure there is nothing else causing your condition. These tests may include: ? Blood tests. ? Urine tests. ? Imaging tests of the abdomen, including X-rays, ultrasounds, MRIs, or CT scans. How is this treated? Most cases of this condition are mild and can be treated at home.  Treatment may include:  Taking over-the-counter pain medicines.  Following a clear liquid diet.  Taking antibiotic medicines by mouth.  Rest. More severe cases may need to be treated at a hospital. Treatment may include:  Not eating or drinking.  Taking prescription pain medicine.  Receiving antibiotic medicines through an IV tube.  Receiving fluids and nutrition through an IV tube.  Surgery. When your condition is under control, your health care provider may recommend that you have a colonoscopy. This is an exam to look at the entire large intestine. During the exam, a lubricated, bendable tube is inserted into the anus and then passed into the rectum, colon, and other parts of the large intestine. A colonoscopy can show how severe your diverticula are and whether something else may be causing your symptoms. Follow these instructions at home: Medicines  Take over-the-counter and prescription medicines only as told by your health care provider. These include fiber supplements, probiotics, and stool softeners.  If you were prescribed an antibiotic medicine, take it as told by your health care provider. Do not stop taking the antibiotic even if you start to feel better.  Do not drive or use heavy machinery while taking prescription pain medicine. General instructions   Follow a full liquid diet or another diet as directed by your health care provider. After your symptoms improve, your health care provider may tell you to change your diet. He or she may recommend that you eat a diet that contains at least 25 g (25 grams) of fiber daily. Fiber makes it easier to pass stool. Healthy sources of fiber include: ? Berries. One cup contains 4-8 grams of   fiber. ? Beans or lentils. One half cup contains 5-8 grams of fiber. ? Green vegetables. One cup contains 4 grams of fiber.  Exercise for at least 30 minutes, 3 times each week. You should exercise hard enough to raise your heart rate and  break a sweat.  Keep all follow-up visits as told by your health care provider. This is important. You may need a colonoscopy. Contact a health care provider if:  Your pain does not improve.  You have a hard time drinking or eating food.  Your bowel movements do not return to normal. Get help right away if:  Your pain gets worse.  Your symptoms do not get better with treatment.  Your symptoms suddenly get worse.  You have a fever.  You vomit more than one time.  You have stools that are bloody, black, or tarry. Summary  Diverticulitis is infection or inflammation of small pouches (diverticula) in the colon that form due to a condition called diverticulosis. Diverticula can trap stool (feces) and bacteria, causing infection and inflammation.  You are at higher risk for this condition if you have diverticulosis and you eat a diet that does not include enough fiber.  Most cases of this condition are mild and can be treated at home. More severe cases may need to be treated at a hospital.  When your condition is under control, your health care provider may recommend that you have an exam called a colonoscopy. This exam can show how severe your diverticula are and whether something else may be causing your symptoms. This information is not intended to replace advice given to you by your health care provider. Make sure you discuss any questions you have with your health care provider. Document Revised: 11/26/2017 Document Reviewed: 01/16/2017 Elsevier Patient Education  2020 [employer]  

## 2020-03-09 LAB — CBC WITH DIFFERENTIAL/PLATELET
Basophils Absolute: 0 10*3/uL (ref 0.0–0.2)
Basos: 1 %
EOS (ABSOLUTE): 0.2 10*3/uL (ref 0.0–0.4)
Eos: 3 %
Hematocrit: 41.6 % (ref 34.0–46.6)
Hemoglobin: 14 g/dL (ref 11.1–15.9)
Immature Grans (Abs): 0 10*3/uL (ref 0.0–0.1)
Immature Granulocytes: 0 %
Lymphocytes Absolute: 1.3 10*3/uL (ref 0.7–3.1)
Lymphs: 27 %
MCH: 30.3 pg (ref 26.6–33.0)
MCHC: 33.7 g/dL (ref 31.5–35.7)
MCV: 90 fL (ref 79–97)
Monocytes Absolute: 0.4 10*3/uL (ref 0.1–0.9)
Monocytes: 8 %
Neutrophils Absolute: 2.9 10*3/uL (ref 1.4–7.0)
Neutrophils: 61 %
Platelets: 309 10*3/uL (ref 150–450)
RBC: 4.62 x10E6/uL (ref 3.77–5.28)
RDW: 12.2 % (ref 11.7–15.4)
WBC: 4.8 10*3/uL (ref 3.4–10.8)

## 2020-03-09 LAB — HEPATIC FUNCTION PANEL
ALT: 12 IU/L (ref 0–32)
AST: 13 IU/L (ref 0–40)
Albumin: 4.2 g/dL (ref 3.8–4.8)
Alkaline Phosphatase: 148 IU/L — ABNORMAL HIGH (ref 39–117)
Bilirubin Total: 0.2 mg/dL (ref 0.0–1.2)
Bilirubin, Direct: 0.07 mg/dL (ref 0.00–0.40)
Total Protein: 6.9 g/dL (ref 6.0–8.5)

## 2020-03-09 LAB — SEDIMENTATION RATE: Sed Rate: 27 mm/hr (ref 0–32)

## 2020-03-09 LAB — C-REACTIVE PROTEIN: CRP: 4 mg/L (ref 0–10)

## 2020-03-11 ENCOUNTER — Telehealth: Payer: Self-pay

## 2020-03-11 NOTE — Telephone Encounter (Signed)
Blood count is normal. Liver enzymes are improving and the alkaline phosphatase is declining. The 2 inflammatory markers (CRP and ESR) are normal.  Written by Mar Daring, PA-C on 03/11/2020 7:24 AM EDT Seen by patient Andrea Hayden on 03/11/2020 8:43 AM EDT

## 2020-03-11 NOTE — Telephone Encounter (Signed)
-----  Message from Mar Daring, PA-C sent at 03/11/2020  7:24 AM EDT ----- Blood count is normal. Liver enzymes are improving and the alkaline phosphatase is declining. The 2 inflammatory markers (CRP and ESR) are normal.

## 2020-03-15 ENCOUNTER — Other Ambulatory Visit: Payer: Self-pay

## 2020-03-15 ENCOUNTER — Ambulatory Visit
Admission: RE | Admit: 2020-03-15 | Discharge: 2020-03-15 | Disposition: A | Discharge status: Home / Self Care | Payer: Managed Care, Other (non HMO) | Source: Ambulatory Visit | Attending: Adult Health | Admitting: Adult Health

## 2020-03-15 DIAGNOSIS — R1032 Left lower quadrant pain: Secondary | ICD-10-CM | POA: Diagnosis present

## 2020-03-18 NOTE — Progress Notes (Signed)
Patient does have sigmoid diverticulosis on CT scan without any other acute findings.  I do suspect she had diverticulitis given exam the day she was seen in the office and treated with antibiotics given this scan was performed later.   Advise increased exercise, high fiber diet and low red meat diet. Keep follow up with Margaretann Loveless, PA-C to discuss any further work up needed.

## 2020-03-20 ENCOUNTER — Encounter: Payer: Managed Care, Other (non HMO) | Admitting: Podiatry

## 2020-03-28 NOTE — Progress Notes (Signed)
Patient: Andrea Hayden, Female    DOB: October 22, 1971, 49 y.o.   MRN: [id number] Visit Date: 03/29/2020  Today's Provider: Mar Daring, PA-C   Chief Complaint  Patient presents with  . Annual Exam   Subjective:     Annual physical exam Andrea Hayden is a 49 y.o. female who presents today for health maintenance and complete physical. She feels well. She reports exercising none. She reports she is sleeping well.  T1DM followed by Endocrine; patient due for eye exam and will call to schedule S/P Hysterectomy followed by OB/GYN-they do mammograms there as well -----------------------------------------------------------------   Review of Systems  Constitutional: Negative.   HENT: Negative.   Eyes: Negative.   Respiratory: Negative.   Cardiovascular: Negative.   Gastrointestinal: Positive for abdominal distention and abdominal pain.  Endocrine: Positive for polyuria.  Genitourinary: Positive for enuresis.  Musculoskeletal: Positive for arthralgias.  Skin: Negative.   Allergic/Immunologic: Negative.   Neurological: Negative.   Hematological: Negative.   Psychiatric/Behavioral: Negative.     Social History      She  reports that she has never smoked. She has never used smokeless tobacco. She reports that she does not drink alcohol or use drugs.       Social History   Socioeconomic History  . Marital status: Married    Spouse name: Not on file  . Number of children: Not on file  . Years of education: Not on file  . Highest education level: Not on file  Occupational History  . Not on file  Tobacco Use  . Smoking status: Never Smoker  . Smokeless tobacco: Never Used  Substance and Sexual Activity  . Alcohol use: No  . Drug use: No  . Sexual activity: Yes    Birth control/protection: None  Other Topics Concern  . Not on file  Social History Narrative  . Not on file   Social Determinants of Health   Financial Resource Strain:   . Difficulty of  Paying Living Expenses:   Food Insecurity:   . Worried About Charity fundraiser in the Last Year:   . Arboriculturist in the Last Year:   Transportation Needs:   . Film/video editor (Medical):   Marland Kitchen Lack of Transportation (Non-Medical):   Physical Activity:   . Days of Exercise per Week:   . Minutes of Exercise per Session:   Stress:   . Feeling of Stress :   Social Connections:   . Frequency of Communication with Friends and Family:   . Frequency of Social Gatherings with Friends and Family:   . Attends Religious Services:   . Active Member of Clubs or Organizations:   . Attends Archivist Meetings:   Marland Kitchen Marital Status:     Past Medical History:  Diagnosis Date  . Abnormal CT scan    Abdominal  . Allergy   . Anxiety 09/14/2007   Osteoarthritis  . Depression   . Diabetes mellitus without complication (HCC)    Type 1: external insulin pump  . GERD (gastroesophageal reflux disease)   . 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 Hypertension   . 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 Sleep disorder   . 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 Residual hemorrhoidal skin tag 02/29/2020  . Diverticulitis 02/28/2020  . Fatigue 02/28/2020  . Pelvic pressure in female 02/28/2020  .  Left lower quadrant abdominal pain 02/28/2020  . Urinary frequency 02/28/2020  . Thickened endometrium 05/16/2018  . Menorrhagia 03/18/2018  . Osteoarthritis of hip 03/18/2018  . Postpartum depression 03/18/2018  . Radial styloid tenosynovitis 03/18/2018  . PVC (premature ventricular contraction) 08/24/2017  . History of bladder repair surgery 08/18/2017  . Placenta accreta in third trimester 08/18/2017  . Family history of cardiac arrhythmia 07/12/2017  . Type 1 diabetes mellitus in pregnancy 05/28/2017  . Chronic hypertension affecting pregnancy 05/28/2017  . Hx of preeclampsia, prior pregnancy, currently  pregnant, second trimester 05/28/2017  . Advanced maternal age in multigravida, second trimester 05/28/2017  . Hx of postpartum depression, currently pregnant 05/28/2017  . DDD (degenerative disc disease), cervical 09/07/2016  . Abnormal CAT scan 06/17/2016  . Dysfunction of eustachian tube 06/17/2016  . Arthralgia of hip 06/17/2016  . BP (high blood pressure) 06/17/2016  . Disordered sleep 06/17/2016  . Obesity (BMI 30-39.9) 11/09/2014  . Accumulation of fluid in tissues 03/05/2014  . Hypercholesterolemia 08/05/2013  . 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 History of pleurisy 10/12/2007  . Arthritis, degenerative 09/14/2007  . Esophagitis, reflux 07/07/2006  . Clinical depression 11/12/2005  . Type 1 diabetes mellitus ([place]) 11/12/2005  . 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 CESAREAN SECTION    . CESAREAN SECTION WITH BILATERAL TUBAL LIGATION N/A 09/11/2013   Procedure: Repeat CESAREAN SECTION of baby boy at [place];  Surgeon: Farrel Gobble. Harrington Challenger, MD;  Location: [facility];  Service: Obstetrics;  Laterality: N/A;  . CHOLECYSTECTOMY  2010  . DILATION AND CURETTAGE OF UTERUS    . hysterectomy  09/21/2017   [facility]  . Sleep Study  04/14/2015   Home sleep study showing no desaturations.   . TUBAL LIGATION      Family History        Family Status  Relation Name Status  . Mother  Alive  . Father  Alive       Heart Ablation  . PGM  Alive  . PGF  Alive       Scoliosis,Bladder infections  . Daughter  Alive  . MGM  Deceased  . MGF  Deceased       Dementia        Her family history includes Cancer in her paternal grandfather and paternal grandmother; Diabetes in her paternal grandmother; Hyperlipidemia in her mother and paternal grandmother; Hypertension in her father, mother, paternal grandfather, and paternal grandmother.      No Known  Allergies   Current Outpatient Medications:  .  BAYER CONTOUR NEXT TEST test strip, Reported on 06/18/2016, Disp: , Rfl: 0 .  escitalopram (LEXAPRO) 10 MG tablet, Take 10 mg by mouth., Disp: , Rfl:  .  glucagon 1 MG injection, Glucagon Emergency Kit 1 mg solution for injection, Disp: , Rfl:  .  glucose blood (CONTOUR NEXT TEST) test strip, USE UP TO SIX TIMES DAILY. TESTING DUE TO PREGNANCY AND HISTORY OF HYPOGLYCEMIA, Disp: , Rfl:  .  hydrochlorothiazide (HYDRODIURIL) 25 MG tablet, Take 1 tablet by mouth daily., Disp: , Rfl:  .  insulin aspart (NOVOLOG) 100 UNIT/ML injection, Novolog U-100 Insulin aspart 100 unit/mL subcutaneous solution, Disp: , Rfl:  .  Insulin Human (INSULIN PUMP) 100 unit/ml SOLN, Inject 1 each into the skin See admin instructions. Insulin pump, Novolog insulin, Disp: , Rfl:  .  meloxicam (MOBIC) 15 MG tablet, Take  1 tablet (15 mg total) by mouth daily. (Patient not taking: Reported on 02/28/2020), Disp: 30 tablet, Rfl: 3 .  metoprolol tartrate (LOPRESSOR) 25 MG tablet, Take 12.5 mg by mouth 2 (two) times daily., Disp: , Rfl:  .  metoprolol tartrate (LOPRESSOR) 25 MG tablet, metoprolol tartrate 25 mg tablet, Disp: , Rfl:  .  NOVOLOG 100 UNIT/ML injection, , Disp: , Rfl: 10 .  PRESCRIPTION MEDICATION, Cholestar-RF qd, Disp: , Rfl:  .  sulfamethoxazole-trimethoprim (BACTRIM DS) 800-160 MG tablet, Take 1 tablet by mouth 2 (two) times daily., Disp: 20 tablet, Rfl: 0   Patient Care Team: ,  M, PA-C as PCP - General (Family Medicine)    Objective:    Vitals: BP (!) 160/96 (BP Location: Left Arm, Patient Position: Sitting, Cuff Size: Large)   Pulse 67   Temp (!) 97.3 F (36.3 C) (Temporal)   Resp 16   Ht 5' 4" (1.626 m)   Wt 232 lb 6.4 oz (105.4 kg)   LMP 01/20/2017   BMI 39.89 kg/m    Vitals:   03/29/20 1415  BP: (!) 160/96  Pulse: 67  Resp: 16  Temp: (!) 97.3 F (36.3 C)  TempSrc: Temporal  Weight: 232 lb 6.4 oz (105.4 kg)  Height: 5' 4"  (1.626 m)     Physical Exam Vitals reviewed.  Constitutional:      General: She is not in acute distress.    Appearance: Normal appearance. She is well-developed. She is obese. She is not ill-appearing or diaphoretic.  HENT:     Head: Normocephalic and atraumatic.     Right Ear: Tympanic membrane, ear canal and external ear normal.     Left Ear: Tympanic membrane, ear canal and external ear normal.  Eyes:     General: No scleral icterus.       Right eye: No discharge.        Left eye: No discharge.     Extraocular Movements: Extraocular movements intact.     Conjunctiva/sclera: Conjunctivae normal.     Pupils: Pupils are equal, round, and reactive to light.  Neck:     Thyroid: No thyromegaly.     Vascular: No carotid bruit or JVD.     Trachea: No tracheal deviation.  Cardiovascular:     Rate and Rhythm: Normal rate and regular rhythm.     Pulses: Normal pulses.     Heart sounds: Normal heart sounds. No murmur. No friction rub. No gallop.   Pulmonary:     Effort: Pulmonary effort is normal. No respiratory distress.     Breath sounds: Normal breath sounds. No wheezing or rales.  Chest:     Chest wall: No tenderness.  Abdominal:     General: Abdomen is protuberant. A surgical scar is present. Bowel sounds are normal. There is no distension.     Palpations: Abdomen is soft. There is no mass.     Tenderness: There is no abdominal tenderness. There is no guarding or rebound.  Musculoskeletal:        General: No tenderness. Normal range of motion.     Cervical back: Normal range of motion and neck supple.     Right lower leg: No edema.     Left lower leg: No edema.  Lymphadenopathy:     Cervical: No cervical adenopathy.  Skin:    General: Skin is warm and dry.     Capillary Refill: Capillary refill takes less than 2 seconds.     Findings: No rash.    Neurological:     General: No focal deficit present.     Mental Status: She is alert and oriented to person, place, and time.  Mental status is at baseline.  Psychiatric:        Mood and Affect: Mood normal.        Behavior: Behavior normal.        Thought Content: Thought content normal.        Judgment: Judgment normal.      Depression Screen PHQ 2/9 Scores 03/29/2020 11/17/2017  PHQ - 2 Score 0 0  PHQ- 9 Score - 0       Assessment & Plan:     Routine Health Maintenance and Physical Exam  Exercise Activities and Dietary recommendations Goals   None     Immunization History  Administered Date(s) Administered  . Influenza Split 11/23/2005, 11/11/2012  . Influenza, Seasonal, Injecte, Preservative Fre 10/09/2008, 12/08/2011  . Influenza,inj,Quad PF,6+ Mos 09/13/2013, 11/02/2014, 10/02/2015, 10/26/2016, 09/27/2017  . Influenza-Unspecified 09/27/2012, 09/28/2015, 09/27/2016  . Pneumococcal-Unspecified 10/28/2001  . Tdap 11/23/2005, 09/12/2013, 08/18/2017    Health Maintenance  Topic Date Due  . PNEUMOCOCCAL POLYSACCHARIDE VACCINE AGE 62-64 HIGH RISK  Never done  . URINE MICROALBUMIN  Never done  . PAP SMEAR-Modifier  04/24/2016  . FOOT EXAM  10/21/2018  . OPHTHALMOLOGY EXAM  04/02/2019  . INFLUENZA VACCINE  07/28/2020  . HEMOGLOBIN A1C  08/04/2020  . TETANUS/TDAP  08/19/2027  . HIV Screening  Completed     Discussed health benefits of physical activity, and encouraged her to engage in regular exercise appropriate for her age and condition.    1. Annual physical exam Normal exam. Up to date on screenings on vaccinations. Followed by endocrine and obgyn. Also sees Dr. Milinda Pointer for foot care.  2. Encounter for breast cancer screening using non-mammogram modality Has done at [facility] in office.  3. Pelvic pressure in female Had an extensive hysterectomy with intra-op complication of cutting the bladder and had hemorrhaging requiring 21 units of blood transfusion. Having chronic lower abdominal pressure and discomfort. Also with urine leakage. Had been told she could be referred to pelvic floor  therapy by her OBGYN but this was not able to be done because covid started (all last year). She had been told they would contact her once they opened back but this was not done. I will place a referral for pelvic floor therapy here as this is a closer and better option. She agrees with referral.  - Ambulatory referral to Physical Therapy  4. Enuresis of organic origin See above medical treatment plan. - Ambulatory referral to Physical Therapy  5. S/P hysterectomy See above medical treatment plan. - Ambulatory referral to Physical Therapy  --------------------------------------------------------------------    Mar Daring, PA-C  [facility]

## 2020-03-29 ENCOUNTER — Encounter: Payer: Self-pay | Admitting: Physician Assistant

## 2020-03-29 ENCOUNTER — Other Ambulatory Visit: Payer: Self-pay

## 2020-03-29 ENCOUNTER — Ambulatory Visit (INDEPENDENT_AMBULATORY_CARE_PROVIDER_SITE_OTHER): Payer: Managed Care, Other (non HMO) | Admitting: Physician Assistant

## 2020-03-29 VITALS — BP 160/96 | HR 67 | Temp 97.3°F | Resp 16 | Ht 64.0 in | Wt 232.4 lb

## 2020-03-29 DIAGNOSIS — Z1239 Encounter for other screening for malignant neoplasm of breast: Secondary | ICD-10-CM

## 2020-03-29 DIAGNOSIS — Z90710 Acquired absence of both cervix and uterus: Secondary | ICD-10-CM

## 2020-03-29 DIAGNOSIS — R32 Unspecified urinary incontinence: Secondary | ICD-10-CM | POA: Diagnosis not present

## 2020-03-29 DIAGNOSIS — Z0000 Encounter for general adult medical examination without abnormal findings: Secondary | ICD-10-CM | POA: Diagnosis not present

## 2020-03-29 DIAGNOSIS — R102 Pelvic and perineal pain: Secondary | ICD-10-CM

## 2020-03-29 NOTE — Patient Instructions (Signed)
Health Maintenance, Female Adopting a healthy lifestyle and getting preventive care are important in promoting health and wellness. Ask your health care provider about:  The right schedule for you to have regular tests and exams.  Things you can do on your own to prevent diseases and keep yourself healthy. What should I know about diet, weight, and exercise? Eat a healthy diet   Eat a diet that includes plenty of vegetables, fruits, low-fat dairy products, and lean protein.  Do not eat a lot of foods that are high in solid fats, added sugars, or sodium. Maintain a healthy weight Body mass index (BMI) is used to identify weight problems. It estimates body fat based on height and weight. Your health care provider can help determine your BMI and help you achieve or maintain a healthy weight. Get regular exercise Get regular exercise. This is one of the most important things you can do for your health. Most adults should:  Exercise for at least 150 minutes each week. The exercise should increase your heart rate and make you sweat (moderate-intensity exercise).  Do strengthening exercises at least twice a week. This is in addition to the moderate-intensity exercise.  Spend less time sitting. Even light physical activity can be beneficial. Watch cholesterol and blood lipids Have your blood tested for lipids and cholesterol at 48 years of age, then have this test every 5 years. Have your cholesterol levels checked more often if:  Your lipid or cholesterol levels are high.  You are older than 48 years of age.  You are at high risk for heart disease. What should I know about cancer screening? Depending on your health history and family history, you may need to have cancer screening at various ages. This may include screening for:  Breast cancer.  Cervical cancer.  Colorectal cancer.  Skin cancer.  Lung cancer. What should I know about heart disease, diabetes, and high blood  pressure? Blood pressure and heart disease  High blood pressure causes heart disease and increases the risk of stroke. This is more likely to develop in people who have high blood pressure readings, are of African descent, or are overweight.  Have your blood pressure checked: ? Every 3-5 years if you are 18-39 years of age. ? Every year if you are 40 years old or older. Diabetes Have regular diabetes screenings. This checks your fasting blood sugar level. Have the screening done:  Once every three years after age 40 if you are at a normal weight and have a low risk for diabetes.  More often and at a younger age if you are overweight or have a high risk for diabetes. What should I know about preventing infection? Hepatitis B If you have a higher risk for hepatitis B, you should be screened for this virus. Talk with your health care provider to find out if you are at risk for hepatitis B infection. Hepatitis C Testing is recommended for:  Everyone born from 1945 through 1965.  Anyone with known risk factors for hepatitis C. Sexually transmitted infections (STIs)  Get screened for STIs, including gonorrhea and chlamydia, if: ? You are sexually active and are younger than 48 years of age. ? You are older than 48 years of age and your health care provider tells you that you are at risk for this type of infection. ? Your sexual activity has changed since you were last screened, and you are at increased risk for chlamydia or gonorrhea. Ask your health care provider if   you are at risk.  Ask your health care provider about whether you are at high risk for HIV. Your health care provider may recommend a prescription medicine to help prevent HIV infection. If you choose to take medicine to prevent HIV, you should first get tested for HIV. You should then be tested every 3 months for as long as you are taking the medicine. Pregnancy  If you are about to stop having your period (premenopausal) and  you may become pregnant, seek counseling before you get pregnant.  Take 400 to 800 micrograms (mcg) of folic acid every day if you become pregnant.  Ask for birth control (contraception) if you want to prevent pregnancy. Osteoporosis and menopause Osteoporosis is a disease in which the bones lose minerals and strength with aging. This can result in bone fractures. If you are 65 years old or older, or if you are at risk for osteoporosis and fractures, ask your health care provider if you should:  Be screened for bone loss.  Take a calcium or vitamin D supplement to lower your risk of fractures.  Be given hormone replacement therapy (HRT) to treat symptoms of menopause. Follow these instructions at home: Lifestyle  Do not use any products that contain nicotine or tobacco, such as cigarettes, e-cigarettes, and chewing tobacco. If you need help quitting, ask your health care provider.  Do not use street drugs.  Do not share needles.  Ask your health care provider for help if you need support or information about quitting drugs. Alcohol use  Do not drink alcohol if: ? Your health care provider tells you not to drink. ? You are pregnant, may be pregnant, or are planning to become pregnant.  If you drink alcohol: ? Limit how much you use to 0-1 drink a day. ? Limit intake if you are breastfeeding.  Be aware of how much alcohol is in your drink. In the [place] one drink equals one 12 oz bottle of beer (355 mL), one 5 oz glass of wine (148 mL), or one 1 oz glass of hard liquor (44 mL). General instructions  Schedule regular health, dental, and eye exams.  Stay current with your vaccines.  Tell your health care provider if: ? You often feel depressed. ? You have ever been abused or do not feel safe at home. Summary  Adopting a healthy lifestyle and getting preventive care are important in promoting health and wellness.  Follow your health care provider's instructions about healthy  diet, exercising, and getting tested or screened for diseases.  Follow your health care provider's instructions on monitoring your cholesterol and blood pressure. This information is not intended to replace advice given to you by your health care provider. Make sure you discuss any questions you have with your health care provider. Document Revised: 12/07/2018 Document Reviewed: 12/07/2018 Elsevier Patient Education  2020 [employer]  

## 2020-04-22 ENCOUNTER — Ambulatory Visit (INDEPENDENT_AMBULATORY_CARE_PROVIDER_SITE_OTHER): Payer: Managed Care, Other (non HMO) | Admitting: Podiatry

## 2020-04-22 ENCOUNTER — Encounter: Payer: Self-pay | Admitting: Podiatry

## 2020-04-22 ENCOUNTER — Other Ambulatory Visit: Payer: Self-pay

## 2020-04-22 DIAGNOSIS — M722 Plantar fascial fibromatosis: Secondary | ICD-10-CM | POA: Diagnosis not present

## 2020-04-22 DIAGNOSIS — Q666 Other congenital valgus deformities of feet: Secondary | ICD-10-CM

## 2020-04-22 DIAGNOSIS — M7662 Achilles tendinitis, left leg: Secondary | ICD-10-CM | POA: Diagnosis not present

## 2020-04-22 NOTE — Progress Notes (Signed)
She presents today for follow-up from plantar fasciitis states that is still sore but seems to be getting there.  She states that currently she does not want an injection but she would entertain physical therapy.  Objective: She has pain on palpation mid calcaneal tubercle left heel.  Pulses are still palpable.  Assessment: Chronic plantar fasciitis left.  Plan: Send her for physical therapy.  Follow-up with her in a few weeks few months.  May need to see about getting some new orthotics.

## 2020-04-25 ENCOUNTER — Ambulatory Visit: Payer: Managed Care, Other (non HMO) | Admitting: Physical Therapy

## 2020-04-30 ENCOUNTER — Ambulatory Visit (INDEPENDENT_AMBULATORY_CARE_PROVIDER_SITE_OTHER): Payer: Managed Care, Other (non HMO) | Admitting: Orthotics

## 2020-04-30 ENCOUNTER — Other Ambulatory Visit: Payer: Self-pay

## 2020-04-30 DIAGNOSIS — M7662 Achilles tendinitis, left leg: Secondary | ICD-10-CM

## 2020-04-30 DIAGNOSIS — Q666 Other congenital valgus deformities of feet: Secondary | ICD-10-CM | POA: Diagnosis not present

## 2020-04-30 NOTE — Progress Notes (Signed)
Patient came into today to be cast for Custom Foot Orthotics. Upon recommendation of Dr. Al Corpus Patient presents with achillens tendon, plantar fas and medial arch collapse  Goals are RF stability, arch support Plan vendor

## 2020-05-02 ENCOUNTER — Ambulatory Visit: Payer: Managed Care, Other (non HMO) | Admitting: Physical Therapy

## 2020-05-02 ENCOUNTER — Other Ambulatory Visit: Payer: Managed Care, Other (non HMO) | Admitting: Orthotics

## 2020-05-08 ENCOUNTER — Encounter: Payer: Self-pay | Admitting: Physical Therapy

## 2020-05-08 ENCOUNTER — Other Ambulatory Visit: Payer: Self-pay

## 2020-05-08 ENCOUNTER — Ambulatory Visit: Payer: Managed Care, Other (non HMO) | Attending: Physician Assistant | Admitting: Physical Therapy

## 2020-05-08 DIAGNOSIS — M6281 Muscle weakness (generalized): Secondary | ICD-10-CM | POA: Diagnosis present

## 2020-05-08 DIAGNOSIS — R293 Abnormal posture: Secondary | ICD-10-CM | POA: Insufficient documentation

## 2020-05-08 DIAGNOSIS — R278 Other lack of coordination: Secondary | ICD-10-CM

## 2020-05-08 NOTE — Therapy (Signed)
[facility] [address] [place], [place], [address] Phone: [phone]   Fax:  [phone]  Physical Therapy Evaluation  Patient Details  Name: Andrea Hayden MRN: [id number] Date of Birth: 01-Oct-1971 Referring Provider (PT): Joycelyn Man   Encounter Date: 05/08/2020  PT End of Session - 05/08/20 1502    Visit Number  1    Number of Visits  12    Date for PT Re-Evaluation  07/31/20    Authorization Type  IE: 05/08/2020    PT Start Time  1500    PT Stop Time  1550    PT Time Calculation (min)  50 min    Activity Tolerance  Patient tolerated treatment well    Behavior During Therapy  [facility] for tasks assessed/performed       Past Medical History:  Diagnosis Date  . Abnormal CT scan    Abdominal  . Allergy   . Anxiety 09/14/2007   Osteoarthritis  . Depression   . Diabetes mellitus without complication (HCC)    Type 1: external insulin pump  . GERD (gastroesophageal reflux disease)   . 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 Hypertension   . 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 Sleep disorder   . 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 CESAREAN SECTION    . CESAREAN SECTION WITH BILATERAL TUBAL LIGATION N/A 09/11/2013   Procedure: Repeat CESAREAN SECTION of baby boy at 1945 APGAR  WITH BILATERAL TUBAL LIGATION;  Surgeon: Freddrick March. Tenny Craw, MD;  Location: [facility];  Service: Obstetrics;  Laterality: N/A;  . CHOLECYSTECTOMY  2010  . DILATION AND CURETTAGE OF UTERUS    . hysterectomy  09/21/2017   [facility]  . Sleep Study  04/14/2015   Home sleep study showing no desaturations.   . TUBAL LIGATION      There were no vitals filed for this visit.       [facility] PT Assessment - 05/08/20 0001      Assessment   Medical Diagnosis  Pelvic pressure    Referring Provider (PT)  Rosezetta Schlatter, Jennifer    Hand Dominance  Left    Prior Therapy  None for  this dx       PELVIC HEALTH PHYSICAL THERAPY EVALUATION  SCREENING Red Flags: None Have you had any night sweats? Unexplained weight loss? Saddle anesthesia? Unexplained changes in bowel or bladder habits?  Precautions: None  SUBJECTIVE  Chief Complaint: Patient notes that during delivery of G3 in September 2018 there were significant complications which resulted in: Per MD: "Had an extensive hysterectomy with intra-op complication of cutting the bladder and had hemorrhaging requiring 21 units of blood transfusion. Having chronic lower abdominal pressure and discomfort. Also with urine leakage."  Patient has unpredictable urinary leakage accompanied by movement, coughing, sneezing, gravity dependent positional changes. Patient notes recent diagnosis of diverticulitis. Patient notes pressure issues are bloating in the lower abdominal area which when great enough can contribute to leakage. Patient has tailbone pain after prolonged sitting with sharp pains on rising which is relieved via chiropractic medicine. Patient notes increased R hip stiffness after prolonged sitting.  Patient notes that she has PF at present.  Pertinent History:  Falls Negative.  Scoliosis Negative. Pulmonary disease/dysfunction Negative. Surgical history: Positive for hysterectomy.   Obstetrical History: G3P3 Deliveries: G1 c-section, G2 c-section, G3 c-section  Gynecological History: Hysterectomy: Yes Abdominal Pain with exam: No  Urinary History: Incontinence: Positive. Onset: 2018 Triggers: movement, coughing/laughing/sneezing, gravity Amount: Min/Mod. Fluid Intake: 64-96 oz H20, with meals unsweetened decaffeinated tea Nocturia: 0x/night Frequency of urination: every 1-2 hours Pain with urination: Negative Difficulty initiating urination: Positive for feeling of incomplete emptying Frequent UTI: Negative.   Gastrointestinal History: [place] Stool Chart: Type 3-4 Frequency of BMs: 3-4x/day Pain  with defecation: Negative Straining with defecation: Negative Incontinence: Negative.  Sexual activity/pain: Pain with intercourse: Negative.   Initial penetration: No  Deep thrustingNo   Location of pain: RLQ Current pain:  0/10  Max pain:  7/10 Least pain:  0/10 Pain quality: pain quality: pulling Radiating pain: No    Patient assessment of present state: "You have no muscles there anymore. The string has unraveled the thread."  Current activities:  Play with family; being outside  Patient Goals:  Decrease leakage and urgency; to feel like I'm not hunched over with abdominal and back pain.  Patient perception of overall health: Good  OBJECTIVE  Mental Status Patient is oriented to person, place and time.  Recent memory is intact.  Remote memory is intact.  Attention span and concentration are intact.  Expressive speech is intact.  Patient's fund of knowledge is within normal limits for educational level.  POSTURE/OBSERVATIONS:  Lumbar lordosis: mildly increased Iliac crest height: L elevated Pelvic obliquity: negative   GAIT: Wide based of support with moderate pronation B. Diminished pelvic rotation with Trendelenburg R: Positive L: Negative  RANGE OF MOTION: deferred 2/2 to time constraints   LEFT RIGHT  Lumbar forward flexion (65):      Lumbar extension (30):     Lumbar lateral flexion (25):     Thoracic and Lumbar rotation (30 degrees):       Hip Flexion (0-125):      Hip IR (0-45):     Hip ER (0-45):     Hip Abduction (0-40):     Hip extension (0-15):       SENSATION: Grossly intact to light touch bilateral LEs as determined by testing dermatomes L2-S2 Proprioception and hot/cold testing deferred on this date  STRENGTH: MMT deferred 2/2 to time constraints  RLE LLE  Hip Flexion    Hip Extension    Hip Abduction     Hip Adduction     Hip ER     Hip IR     Knee Extension    Knee Flexion    Dorsiflexion     Plantarflexion (seated)      ABDOMINAL:  Palpation: not performed 2/2 to time Diastasis: visible in seated with lumbar extension Scar mobility: not performed 2/2 to time   SPECIAL TESTS: deferred 2/2 to time constraints  PHYSICAL PERFORMANCE MEASURES: STS: WNL   EXTERNAL PELVIC EXAM: deferred 2/2 to time constraints Palpation: Breath coordination: Cued Lengthen: Cued Contraction: Cough:  INTERNAL VAGINAL EXAM: deferred 2/2 to time constraints Introitus Appears:  Skin integrity:  Scar mobility: Strength (PERF):  Symmetry: Palpation: Prolapse:   INTERNAL RECTAL EXAM: deferred 2/2 to time constraints Strength (PERF): Symmetry: Palpation: Prolapse:   OUTCOME MEASURES: FOTO Urinary 51   ASSESSMENT Patient is a 49 year old presenting to clinic with chief complaints of lower abdominal pressure/pain and UI with stress and urgency components. Upon examination, patient demonstrates deficits in urinary urge suppression, IAP management, trunk strength, posture, PFM coordination, PFM strength, and hip mobility as evidenced by increased stiffness in R hip with prolonged sitting, urinary leakage with coughing/laughing/positional changes as well as urgency and increased frequency of urination,  presence of DRAM visible with lumbar extension. Patient's responses on FOTO outcome measures (51) indicate moderate functional limitations/disability/distress. Patient's progress may be limited due to number of abdominal wall surgical procedures; however, patient's motivation is advantageous. Patient was able to achieve basic understanding of typical bladder function and PFM functions during today's evaluation and responded positively to educational interventions. Patient will benefit from continued skilled therapeutic intervention to address deficits in urinary urge suppression, IAP management, trunk strength, posture, PFM coordination, PFM strength, and hip mobility in order to increase function, and improve overall  QOL.  EDUCATION Patient educated on prognosis, POC, and provided with HEP including: bladder diary. Patient articulated understanding and returned demonstration. Patient will benefit from further education in order to maximize compliance and understanding for long-term therapeutic gains.  TREATMENT  Neuromuscular Re-education: Patient educated on primary functions of the pelvic floor including: posture/balance, sexual pleasure, storage and elimination of waste from the body, abdominal cavity closure, and breath coordination. Patient educated extensively on typical bladder function, typical bladder habits, basics of urge suppression in order to better regulate bladder through behavioral changes.    Objective measurements completed on examination: See above findings.                   PT Long Term Goals - 05/08/20 1814      PT LONG TERM GOAL #1   Title  Patient will demonstrate independence with HEP in order to maximize therapeutic gains and improve carryover from physical therapy sessions to ADLs in the home and community.    Baseline  IE: not demonstrated    Time  12    Period  Weeks    Status  New    Target Date  07/31/20      PT LONG TERM GOAL #2   Title  Patient will demonstrate independent and coordinated diaphragmatic breathing in supine with a 1:2 breathing pattern for improved down-regulation of the nervous system and improved management of intra-abdominal pressures in order to increase function at home and in the community.    Baseline  IE: not demonstrated    Time  12    Period  Weeks    Status  New    Target Date  07/31/20      PT LONG TERM GOAL #3   Title  Patient will report decreased urgency and UUI when returning to home to < 50% frequency in order to demonstrate improved PFM coordination, strength, and function for improved overall QOL.    Baseline  IE: 75-100%    Time  12    Period  Weeks    Status  New    Target Date  07/31/20      PT LONG TERM  GOAL #4   Title  Patient will demonstrate improved function as evidenced by a score of 62 on FOTO measure for full participation in activities at home and in the community.    Baseline  IE: 51    Time  12    Period  Weeks    Status  New    Target Date  07/31/20      PT LONG TERM GOAL #5   Title  Patient will demonstrate improved management of IAP as evidenced by application of breath strategies and [facility] Phase 3 completion in order to decrease incidence of UI.    Baseline  IE: not demomstrated    Time  12    Period  Weeks    Status  New  Target Date  07/31/20             Plan - 05/08/20 1503    Clinical Impression Statement  Patient is a 49 year old presenting to clinic with chief complaints of lower abdominal pressure/pain and UI with stress and urgency components. Upon examination, patient demonstrates deficits in urinary urge suppression, IAP management, trunk strength, posture, PFM coordination, PFM strength, and hip mobility as evidenced by increased stiffness in R hip with prolonged sitting, urinary leakage with coughing/laughing/positional changes as well as urgency and increased frequency of urination, presence of DRAM visible with lumbar extension. Patient's responses on FOTO outcome measures (51) indicate moderate functional limitations/disability/distress. Patient's progress may be limited due to number of abdominal wall surgical procedures; however, patient's motivation is advantageous. Patient was able to achieve basic understanding of typical bladder function and PFM functions during today's evaluation and responded positively to educational interventions. Patient will benefit from continued skilled therapeutic intervention to address deficits in urinary urge suppression, IAP management, trunk strength, posture, PFM coordination, PFM strength, and hip mobility in order to increase function, and improve overall QOL.    Personal Factors and  Comorbidities  Age;Education;Behavior Pattern;Comorbidity 3+;Past/Current Experience;Fitness;Time since onset of injury/illness/exacerbation    Comorbidities  anxiety, depression, DM, GERD, HLD, HTN    Examination-Activity Limitations  Sit;Transfers;Sleep;Squat;Bend;Lift;Locomotion Level;Stairs;Stand;Reach Overhead;Continence    Examination-Participation Restrictions  Interpersonal Relationship;Yard Work;Cleaning;Laundry;Community Activity;Shop    Stability/Clinical Decision Making  Evolving/Moderate complexity    Clinical Decision Making  Moderate    Rehab Potential  Fair    PT Frequency  1x / week    PT Duration  12 weeks    PT Treatment/Interventions  Moist Heat;Cryotherapy;ADLs/Self Care Home Management;Electrical Stimulation;Gait training;Stair training;Therapeutic exercise;Balance training;Neuromuscular re-education;Patient/family education;Manual techniques;Passive range of motion;Dry needling;Scar mobilization;Taping;Spinal Manipulations;Joint Manipulations    PT Next Visit Plan  External PFM assessment; DRAM corrective exercises    PT Home Exercise Plan  bladder diary    Consulted and Agree with Plan of Care  Patient       Patient will benefit from skilled therapeutic intervention in order to improve the following deficits and impairments:  Abnormal gait, Decreased balance, Decreased endurance, Decreased mobility, Improper body mechanics, Postural dysfunction, Impaired flexibility, Decreased strength, Decreased coordination, Decreased activity tolerance, Decreased range of motion, Decreased scar mobility  Visit Diagnosis: Muscle weakness (generalized)  Other lack of coordination  Abnormal posture     Problem List Patient Active Problem List   Diagnosis Date Noted  . S/P hysterectomy 03/29/2020  . Residual hemorrhoidal skin tag 02/29/2020  . Diverticulitis 02/28/2020  . Fatigue 02/28/2020  . Pelvic pressure in female 02/28/2020  . Left lower quadrant abdominal pain  02/28/2020  . Urinary frequency 02/28/2020  . Thickened endometrium 05/16/2018  . Menorrhagia 03/18/2018  . Osteoarthritis of hip 03/18/2018  . Postpartum depression 03/18/2018  . Radial styloid tenosynovitis 03/18/2018  . PVC (premature ventricular contraction) 08/24/2017  . History of bladder repair surgery 08/18/2017  . Placenta accreta in third trimester 08/18/2017  . Family history of cardiac arrhythmia 07/12/2017  . Type 1 diabetes mellitus in pregnancy 05/28/2017  . Chronic hypertension affecting pregnancy 05/28/2017  . Hx of preeclampsia, prior pregnancy, currently pregnant, second trimester 05/28/2017  . Advanced maternal age in multigravida, second trimester 05/28/2017  . Hx of postpartum depression, currently pregnant 05/28/2017  . DDD (degenerative disc disease), cervical 09/07/2016  . Abnormal CAT scan 06/17/2016  . Dysfunction of eustachian tube 06/17/2016  . Arthralgia of hip 06/17/2016  . BP (high blood pressure)  06/17/2016  . Disordered sleep 06/17/2016  . Obesity (BMI 30-39.9) 11/09/2014  . Accumulation of fluid in tissues 03/05/2014  . Hypercholesterolemia 08/05/2013  . 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 History of pleurisy 10/12/2007  . Arthritis, degenerative 09/14/2007  . Esophagitis, reflux 07/07/2006  . Clinical depression 11/12/2005  . Type 1 diabetes mellitus (HCC) 11/12/2005  . Combined fat and carbohydrate induced hyperlipemia 11/12/2005   Sheria Lang PT, DPT [phone] 05/08/2020, 6:15 PM  [facility] [address] [place], [place], [address] Phone: [phone]   Fax:  [phone]  Name: Andrea Hayden MRN: [id number] Date of Birth: 18-Nov-1971

## 2020-05-15 ENCOUNTER — Other Ambulatory Visit: Payer: Self-pay

## 2020-05-15 ENCOUNTER — Ambulatory Visit: Payer: Managed Care, Other (non HMO) | Admitting: Physical Therapy

## 2020-05-15 ENCOUNTER — Encounter: Payer: Self-pay | Admitting: Physical Therapy

## 2020-05-15 DIAGNOSIS — R278 Other lack of coordination: Secondary | ICD-10-CM

## 2020-05-15 DIAGNOSIS — M6281 Muscle weakness (generalized): Secondary | ICD-10-CM | POA: Diagnosis not present

## 2020-05-15 DIAGNOSIS — R293 Abnormal posture: Secondary | ICD-10-CM

## 2020-05-15 NOTE — Therapy (Signed)
[facility] [address] [place], [place], [address] Phone: [phone]   Fax:  [phone]  Physical Therapy Treatment  Patient Details  Name: Andrea Hayden MRN: [id number] Date of Birth: 28-Apr-1971 Referring Provider (PT): Fenton Malling   Encounter Date: 05/15/2020  PT End of Session - 05/15/20 1612    Visit Number  2    Number of Visits  12    Date for PT Re-Evaluation  07/31/20    Authorization Type  IE: 05/08/2020    PT Start Time  1600    PT Stop Time  1655    PT Time Calculation (min)  55 min    Activity Tolerance  Patient tolerated treatment well    Behavior During Therapy  [facility] for tasks assessed/performed       Past Medical History:  Diagnosis Date  . Abnormal CT scan    Abdominal  . Allergy   . Anxiety 09/14/2007   Osteoarthritis  . Depression   . Diabetes mellitus without complication (HCC)    Type 1: external insulin pump  . GERD (gastroesophageal reflux disease)   . 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 Hypertension   . 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 Sleep disorder   . 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 CESAREAN SECTION    . CESAREAN SECTION WITH BILATERAL TUBAL LIGATION N/A 09/11/2013   Procedure: Repeat CESAREAN SECTION of baby boy at [place];  Surgeon: Farrel Gobble. Harrington Challenger, MD;  Location: [facility];  Service: Obstetrics;  Laterality: N/A;  . CHOLECYSTECTOMY  2010  . DILATION AND CURETTAGE OF UTERUS    . hysterectomy  09/21/2017   [facility]  . Sleep Study  04/14/2015   Home sleep study showing no desaturations.   . TUBAL LIGATION      There were no vitals filed for this visit.  Subjective Assessment - 05/15/20 1600    Subjective  Patient presents to clinic with bladder diary with indications for appropriate timing of emptying with occasional outliers. Patient does not have  excessively increased frequencies but does note some increased urgency. Patient had ~ 2 bouts of UI over 3 days with bending over/sneezing. Patient does have occasional emptying with breaks in urine flow. Patient states that she feels relief in her back pain when her abdominals are supported.    Currently in Pain?  Yes    Pain Score  1     Pain Location  Hip    Pain Orientation  Right;Posterior;Mid    Pain Descriptors / Indicators  Jabbing      TREATMENT  Pre-treatment assessment: RANGE OF MOTION:    LEFT RIGHT  Lumbar forward flexion (65):  WNL    Lumbar extension (30): WNL    Lumbar lateral flexion (25):  WNL WNL  Thoracic and Lumbar rotation (30 degrees):    WNL WNL  Hip Flexion (0-125):   WNL WNL  Hip IR (0-45):  WNL WNL  Hip ER (0-45):  WNL WNL  Hip Abduction (0-40):  WNL WNL  Hip extension (0-15):  WNL WNL    STRENGTH: MMT   RLE LLE  Hip Flexion 4 5  Hip Extension 5 5  Hip Abduction  5 5  Hip Adduction  5 5  Hip ER  5 5  Hip IR  5 5  Knee Extension 5 5  Knee Flexion 5  5  Dorsiflexion  5 5  Plantarflexion (seated) 5 5   ABDOMINAL:  Palpation: no TTP Diastasis: > 4 finger width at umbilicus; 3 finger width above and below Scar mobility: no restrictions noted  EXTERNAL PELVIC EXAM:  Breath coordination: present Cued Lengthen: no deficits Cued Contraction: no deficits Cough: no PFM activity; apparent doming at linea alba  Neuromuscular Re-education: Supine hooklying diaphragmatic breathing with VCs and TCs for downregulation of the nervous system and improved management of IAP Supine diastasis closure with coordinated breath for improved IAP management and abdominal coordination Supine hooklying, TrA activation with exhalation. VCs and TCs to decrease compensatory patterns and minimize aggravation of the lumbar paraspinals. Patient education on intra-abdominal pressure management and impacts on PFM and abdominal coordination during physical stresses:  cough/laugh/sneeze, bed mobility, transfers, etc. Pelvic/gluteal MET for improved iliococcygeus extensibility/release and decreased pain at coccyx   Patient educated throughout session on appropriate technique and form using multi-modal cueing, HEP, and activity modification. Patient articulated understanding and returned demonstration.  Patient Response to interventions: Patient comfortable with HEP.  ASSESSMENT Patient presents to clinic with excellent motivation to participate in therapy. Patient demonstrates deficits in urinary urge suppression, IAP management, trunk strength, posture, PFM coordination, PFM strength, and hip mobility. Patient able to achieve TrA activation in supine and sitting during today's session and responded positively to active and educational interventions. Patient will benefit from continued skilled therapeutic intervention to address remaining deficits in urinary urge suppression, IAP management, trunk strength, posture, PFM coordination, PFM strength, and hip mobility in order to increase function and improve overall QOL.   PT Long Term Goals - 05/08/20 1814      PT LONG TERM GOAL #1   Title  Patient will demonstrate independence with HEP in order to maximize therapeutic gains and improve carryover from physical therapy sessions to ADLs in the home and community.    Baseline  IE: not demonstrated    Time  12    Period  Weeks    Status  New    Target Date  07/31/20      PT LONG TERM GOAL #2   Title  Patient will demonstrate independent and coordinated diaphragmatic breathing in supine with a 1:2 breathing pattern for improved down-regulation of the nervous system and improved management of intra-abdominal pressures in order to increase function at home and in the community.    Baseline  IE: not demonstrated    Time  12    Period  Weeks    Status  New    Target Date  07/31/20      PT LONG TERM GOAL #3   Title  Patient will report decreased urgency and UUI  when returning to home to < 50% frequency in order to demonstrate improved PFM coordination, strength, and function for improved overall QOL.    Baseline  IE: 75-100%    Time  12    Period  Weeks    Status  New    Target Date  07/31/20      PT LONG TERM GOAL #4   Title  Patient will demonstrate improved function as evidenced by a score of 62 on FOTO measure for full participation in activities at home and in the community.    Baseline  IE: 51    Time  12    Period  Weeks    Status  New    Target Date  07/31/20      PT LONG TERM GOAL #5  Title  Patient will demonstrate improved management of IAP as evidenced by application of breath strategies and Sahrmann Abdominal Rehabilitation Phase 3 completion in order to decrease incidence of UI.    Baseline  IE: not demomstrated    Time  12    Period  Weeks    Status  New    Target Date  07/31/20            Plan - 05/15/20 1613    Clinical Impression Statement  Patient presents to clinic with excellent motivation to participate in therapy. Patient demonstrates deficits in urinary urge suppression, IAP management, trunk strength, posture, PFM coordination, PFM strength, and hip mobility. Patient able to achieve TrA activation in supine and sitting during today's session and responded positively to active and educational interventions. Patient will benefit from continued skilled therapeutic intervention to address remaining deficits in urinary urge suppression, IAP management, trunk strength, posture, PFM coordination, PFM strength, and hip mobility in order to increase function and improve overall QOL.    Personal Factors and Comorbidities  Age;Education;Behavior Pattern;Comorbidity 3+;Past/Current Experience;Fitness;Time since onset of injury/illness/exacerbation    Comorbidities  anxiety, depression, DM, GERD, HLD, HTN    Examination-Activity Limitations  Sit;Transfers;Sleep;Squat;Bend;Lift;Locomotion Level;Stairs;Stand;Reach  Overhead;Continence    Examination-Participation Restrictions  Interpersonal Relationship;Yard Work;Cleaning;Laundry;Community Activity;Shop    Stability/Clinical Decision Making  Evolving/Moderate complexity    Rehab Potential  Fair    PT Frequency  1x / week    PT Duration  12 weeks    PT Treatment/Interventions  Moist Heat;Cryotherapy;ADLs/Self Care Home Management;Electrical Stimulation;Gait training;Stair training;Therapeutic exercise;Balance training;Neuromuscular re-education;Patient/family education;Manual techniques;Passive range of motion;Dry needling;Scar mobilization;Taping;Spinal Manipulations;Joint Manipulations    PT Next Visit Plan  External PFM assessment; DRAM corrective exercises    PT Home Exercise Plan  bladder diary    Consulted and Agree with Plan of Care  Patient       Patient will benefit from skilled therapeutic intervention in order to improve the following deficits and impairments:  Abnormal gait, Decreased balance, Decreased endurance, Decreased mobility, Improper body mechanics, Postural dysfunction, Impaired flexibility, Decreased strength, Decreased coordination, Decreased activity tolerance, Decreased range of motion, Decreased scar mobility  Visit Diagnosis: Muscle weakness (generalized)  Other lack of coordination  Abnormal posture     Problem List Patient Active Problem List   Diagnosis Date Noted  . S/P hysterectomy 03/29/2020  . Residual hemorrhoidal skin tag 02/29/2020  . Diverticulitis 02/28/2020  . Fatigue 02/28/2020  . Pelvic pressure in female 02/28/2020  . Left lower quadrant abdominal pain 02/28/2020  . Urinary frequency 02/28/2020  . Thickened endometrium 05/16/2018  . Menorrhagia 03/18/2018  . Osteoarthritis of hip 03/18/2018  . Postpartum depression 03/18/2018  . Radial styloid tenosynovitis 03/18/2018  . PVC (premature ventricular contraction) 08/24/2017  . History of bladder repair surgery 08/18/2017  . Placenta accreta in  third trimester 08/18/2017  . Family history of cardiac arrhythmia 07/12/2017  . Type 1 diabetes mellitus in pregnancy 05/28/2017  . Chronic hypertension affecting pregnancy 05/28/2017  . Hx of preeclampsia, prior pregnancy, currently pregnant, second trimester 05/28/2017  . Advanced maternal age in multigravida, second trimester 05/28/2017  . Hx of postpartum depression, currently pregnant 05/28/2017  . DDD (degenerative disc disease), cervical 09/07/2016  . Abnormal CAT scan 06/17/2016  . Dysfunction of eustachian tube 06/17/2016  . Arthralgia of hip 06/17/2016  . BP (high blood pressure) 06/17/2016  . Disordered sleep 06/17/2016  . Obesity (BMI 30-39.9) 11/09/2014  . Accumulation of fluid in tissues 03/05/2014  . Hypercholesterolemia 08/05/2013  .  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 History of pleurisy 10/12/2007  . Arthritis, degenerative 09/14/2007  . Esophagitis, reflux 07/07/2006  . Clinical depression 11/12/2005  . Type 1 diabetes mellitus ([place]) 11/12/2005  . Combined fat and carbohydrate induced hyperlipemia 11/12/2005   Myles Gip PT, DPT [phone] 05/15/2020, 5:57 PM  [facility] [address] [place], [place], [address] Phone: [phone]   Fax:  [phone]  Name: MARTIN SMEAL MRN: [id number] Date of Birth: 03-15-1971

## 2020-05-22 ENCOUNTER — Encounter: Payer: Self-pay | Admitting: Physical Therapy

## 2020-05-22 ENCOUNTER — Ambulatory Visit: Payer: Managed Care, Other (non HMO) | Admitting: Physical Therapy

## 2020-05-22 ENCOUNTER — Other Ambulatory Visit: Payer: Self-pay

## 2020-05-22 DIAGNOSIS — R278 Other lack of coordination: Secondary | ICD-10-CM

## 2020-05-22 DIAGNOSIS — R293 Abnormal posture: Secondary | ICD-10-CM

## 2020-05-22 DIAGNOSIS — M6281 Muscle weakness (generalized): Secondary | ICD-10-CM

## 2020-05-22 NOTE — Therapy (Signed)
[facility] [address] [place], [place], [address] Phone: [phone]   Fax:  [phone]  Physical Therapy Treatment  Patient Details  Name: Andrea Hayden MRN: [id number] Date of Birth: August 22, 1971 Referring Provider (PT): Joycelyn Man   Encounter Date: 05/22/2020  PT End of Session - 05/22/20 1509    Visit Number  3    Number of Visits  12    Date for PT Re-Evaluation  07/31/20    Authorization Type  IE: 05/08/2020    PT Start Time  1505    PT Stop Time  1600    PT Time Calculation (min)  55 min    Activity Tolerance  Patient tolerated treatment well    Behavior During Therapy  [facility] for tasks assessed/performed       Past Medical History:  Diagnosis Date  . Abnormal CT scan    Abdominal  . Allergy   . Anxiety 09/14/2007   Osteoarthritis  . Depression   . Diabetes mellitus without complication (HCC)    Type 1: external insulin pump  . GERD (gastroesophageal reflux disease)   . 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 Hypertension   . 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 Sleep disorder   . 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 CESAREAN SECTION    . CESAREAN SECTION WITH BILATERAL TUBAL LIGATION N/A 09/11/2013   Procedure: Repeat CESAREAN SECTION of baby boy at 1945 [facility]  WITH BILATERAL TUBAL LIGATION;  Surgeon: Freddrick March. Tenny Craw, MD;  Location: [facility];  Service: Obstetrics;  Laterality: N/A;  . CHOLECYSTECTOMY  2010  . DILATION AND CURETTAGE OF UTERUS    . hysterectomy  09/21/2017   [facility]  . Sleep Study  04/14/2015   Home sleep study showing no desaturations.   . TUBAL LIGATION      There were no vitals filed for this visit.  Subjective Assessment - 05/22/20 1506    Subjective  Patient presents to clinic with altered gait 2/2 to sharp pain in the top of the foot which she is concerned is a stress fx based on her previous  experience. She is following up with podiatry and has been icing to ease the pain. Patient has been working on her breathing, but notes she has not been doing her DRAM correction exercises with consistency. Patient continues to have R hip pain.    Currently in Pain?  Yes    Pain Score  7     Pain Location  Foot    Pain Orientation  Left;Anterior    Pain Descriptors / Indicators  Sharp       TREATMENT Manual Therapy: R LAD of hip for decreased pain and improved mobility; gentle oscillations with sustained holds at varying angles of abduction  Neuromuscular Re-education: Supine hooklying diaphragmatic breathing with VCs and TCs for downregulation of the nervous system and improved management of IAP Supine diastasis closure with coordinated breath for improved IAP management and abdominal coordination Supine hooklying, TrA activation with exhalation. VCs and TCs to decrease compensatory patterns and minimize aggravation of the lumbar paraspinals. Seated TrA activation with exhalation. VCs and TCs to decrease compensatory patterns. Performed with GTB for improved postural awareness Standing Pilates serve a tray, GTB, with exhalation. VCs to encourage TrA activation distally to inferiorly Patient education on fitsplint/abdominal brace for increased postural support and decreased DRAM.  Patient educated throughout session on appropriate  technique and form using multi-modal cueing, HEP, and activity modification. Patient articulated understanding and returned demonstration.  Patient Response to interventions: Patient comfortable with HEP.  ASSESSMENT Patient presents to clinic with excellent motivation to participate in therapy. Patient demonstrates deficits in urinary urge suppression, IAP management, trunk strength, posture, PFM coordination, PFM strength, and hip mobility. Patient able to achieve TrA activation in variety of positions with tactile cueing and limited consistency during today's  session and responded positively to active interventions. Patient will benefit from continued skilled therapeutic intervention to address remaining deficits in urinary urge suppression, IAP management, trunk strength, posture, PFM coordination, PFM strength, and hip mobility in order to increase function and improve overall QOL.     PT Long Term Goals - 05/08/20 1814      PT LONG TERM GOAL #1   Title  Patient will demonstrate independence with HEP in order to maximize therapeutic gains and improve carryover from physical therapy sessions to ADLs in the home and community.    Baseline  IE: not demonstrated    Time  12    Period  Weeks    Status  New    Target Date  07/31/20      PT LONG TERM GOAL #2   Title  Patient will demonstrate independent and coordinated diaphragmatic breathing in supine with a 1:2 breathing pattern for improved down-regulation of the nervous system and improved management of intra-abdominal pressures in order to increase function at home and in the community.    Baseline  IE: not demonstrated    Time  12    Period  Weeks    Status  New    Target Date  07/31/20      PT LONG TERM GOAL #3   Title  Patient will report decreased urgency and UUI when returning to home to < 50% frequency in order to demonstrate improved PFM coordination, strength, and function for improved overall QOL.    Baseline  IE: 75-100%    Time  12    Period  Weeks    Status  New    Target Date  07/31/20      PT LONG TERM GOAL #4   Title  Patient will demonstrate improved function as evidenced by a score of 62 on FOTO measure for full participation in activities at home and in the community.    Baseline  IE: 51    Time  12    Period  Weeks    Status  New    Target Date  07/31/20      PT LONG TERM GOAL #5   Title  Patient will demonstrate improved management of IAP as evidenced by application of breath strategies and Sahrmann Abdominal Rehabilitation Phase 3 completion in order to  decrease incidence of UI.    Baseline  IE: not demomstrated    Time  12    Period  Weeks    Status  New    Target Date  07/31/20            Plan - 05/22/20 1509    Clinical Impression Statement  Patient presents to clinic with excellent motivation to participate in therapy. Patient demonstrates deficits in urinary urge suppression, IAP management, trunk strength, posture, PFM coordination, PFM strength, and hip mobility. Patient able to achieve TrA activation in variety of positions with tactile cueing and limited consistency during today's session and responded positively to active interventions. Patient will benefit from continued skilled therapeutic intervention to address  remaining deficits in urinary urge suppression, IAP management, trunk strength, posture, PFM coordination, PFM strength, and hip mobility in order to increase function and improve overall QOL.    Personal Factors and Comorbidities  Age;Education;Behavior Pattern;Comorbidity 3+;Past/Current Experience;Fitness;Time since onset of injury/illness/exacerbation    Comorbidities  anxiety, depression, DM, GERD, HLD, HTN    Examination-Activity Limitations  Sit;Transfers;Sleep;Squat;Bend;Lift;Locomotion Level;Stairs;Stand;Reach Overhead;Continence    Examination-Participation Restrictions  Interpersonal Relationship;Yard Work;Cleaning;Laundry;Community Activity;Shop    Stability/Clinical Decision Making  Evolving/Moderate complexity    Rehab Potential  Fair    PT Frequency  1x / week    PT Duration  12 weeks    PT Treatment/Interventions  Moist Heat;Cryotherapy;ADLs/Self Care Home Management;Electrical Stimulation;Gait training;Stair training;Therapeutic exercise;Balance training;Neuromuscular re-education;Patient/family education;Manual techniques;Passive range of motion;Dry needling;Scar mobilization;Taping;Spinal Manipulations;Joint Manipulations    PT Next Visit Plan  DRAM corrective exercises; kegels    PT Home Exercise  Plan  bladder diary    Consulted and Agree with Plan of Care  Patient       Patient will benefit from skilled therapeutic intervention in order to improve the following deficits and impairments:  Abnormal gait, Decreased balance, Decreased endurance, Decreased mobility, Improper body mechanics, Postural dysfunction, Impaired flexibility, Decreased strength, Decreased coordination, Decreased activity tolerance, Decreased range of motion, Decreased scar mobility  Visit Diagnosis: Muscle weakness (generalized)  Other lack of coordination  Abnormal posture     Problem List Patient Active Problem List   Diagnosis Date Noted  . S/P hysterectomy 03/29/2020  . Residual hemorrhoidal skin tag 02/29/2020  . Diverticulitis 02/28/2020  . Fatigue 02/28/2020  . Pelvic pressure in female 02/28/2020  . Left lower quadrant abdominal pain 02/28/2020  . Urinary frequency 02/28/2020  . Thickened endometrium 05/16/2018  . Menorrhagia 03/18/2018  . Osteoarthritis of hip 03/18/2018  . Postpartum depression 03/18/2018  . Radial styloid tenosynovitis 03/18/2018  . PVC (premature ventricular contraction) 08/24/2017  . History of bladder repair surgery 08/18/2017  . Placenta accreta in third trimester 08/18/2017  . Family history of cardiac arrhythmia 07/12/2017  . Type 1 diabetes mellitus in pregnancy 05/28/2017  . Chronic hypertension affecting pregnancy 05/28/2017  . Hx of preeclampsia, prior pregnancy, currently pregnant, second trimester 05/28/2017  . Advanced maternal age in multigravida, second trimester 05/28/2017  . Hx of postpartum depression, currently pregnant 05/28/2017  . DDD (degenerative disc disease), cervical 09/07/2016  . Abnormal CAT scan 06/17/2016  . Dysfunction of eustachian tube 06/17/2016  . Arthralgia of hip 06/17/2016  . BP (high blood pressure) 06/17/2016  . Disordered sleep 06/17/2016  . Obesity (BMI 30-39.9) 11/09/2014  . Accumulation of fluid in tissues 03/05/2014   . Hypercholesterolemia 08/05/2013  . 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 History of pleurisy 10/12/2007  . Arthritis, degenerative 09/14/2007  . Esophagitis, reflux 07/07/2006  . Clinical depression 11/12/2005  . Type 1 diabetes mellitus (HCC) 11/12/2005  . Combined fat and carbohydrate induced hyperlipemia 11/12/2005   Sheria Lang PT, DPT [phone]  05/22/2020, 6:16 PM  [facility] [address] [place], [place], [address] Phone: [phone]   Fax:  [phone]  Name: Andrea Hayden MRN: [id number] Date of Birth: Jun 30, 1971

## 2020-05-29 ENCOUNTER — Ambulatory Visit: Payer: Managed Care, Other (non HMO) | Attending: Physician Assistant | Admitting: Physical Therapy

## 2020-05-29 ENCOUNTER — Encounter: Payer: Self-pay | Admitting: Podiatry

## 2020-05-29 ENCOUNTER — Other Ambulatory Visit: Payer: Self-pay

## 2020-05-29 ENCOUNTER — Ambulatory Visit (INDEPENDENT_AMBULATORY_CARE_PROVIDER_SITE_OTHER): Payer: Managed Care, Other (non HMO) | Admitting: Orthotics

## 2020-05-29 ENCOUNTER — Ambulatory Visit (INDEPENDENT_AMBULATORY_CARE_PROVIDER_SITE_OTHER): Payer: Managed Care, Other (non HMO) | Admitting: Podiatry

## 2020-05-29 ENCOUNTER — Encounter: Payer: Self-pay | Admitting: Physical Therapy

## 2020-05-29 DIAGNOSIS — M722 Plantar fascial fibromatosis: Secondary | ICD-10-CM

## 2020-05-29 DIAGNOSIS — Q666 Other congenital valgus deformities of feet: Secondary | ICD-10-CM | POA: Diagnosis not present

## 2020-05-29 DIAGNOSIS — R293 Abnormal posture: Secondary | ICD-10-CM | POA: Insufficient documentation

## 2020-05-29 DIAGNOSIS — M76812 Anterior tibial syndrome, left leg: Secondary | ICD-10-CM

## 2020-05-29 DIAGNOSIS — R278 Other lack of coordination: Secondary | ICD-10-CM | POA: Insufficient documentation

## 2020-05-29 DIAGNOSIS — M6281 Muscle weakness (generalized): Secondary | ICD-10-CM | POA: Insufficient documentation

## 2020-05-29 NOTE — Progress Notes (Signed)
She presents today states that most of the plantar fasciitis to the left foot is gone however I am starting to experience some pain across the top of the left foot do not know if is a stress fracture or what it is but it is coming from over here as she points to the insertion of the tibialis anterior medially and demonstrates pain at the tendon itself.  Objective: Vital signs are stable she is alert and oriented x3 no pain on palpation medial tender tubercle of the left foot.  She has pain on palpation of the distalmost aspect of the tendon tibialis anterior tendon at its insertion medially.  No radiating pain is noted with palpation to this area however inversion and dorsiflexion does replicate the pain.  Assessment: Insertional tibialis anterior tendon as well as resolving plantar fasciitis.  Plan: Recommended methylprednisolone meloxicam Voltaren gel.  States that she did not want an injection at this point will follow up with me as needed.

## 2020-05-29 NOTE — Progress Notes (Signed)
Patient came in today to pick up custom made foot orthotics.  The goals were accomplished and the patient reported no dissatisfaction with said orthotics.  Patient was advised of breakin period and how to report any issues. 

## 2020-05-29 NOTE — Therapy (Signed)
[facility] [address] [place], [place], [address] Phone: [phone]   Fax:  [phone]  Physical Therapy Treatment  Patient Details  Name: Andrea Hayden MRN: [id number] Date of Birth: Jan 08, 1971 Referring Provider (PT): Joycelyn Man   Encounter Date: 05/29/2020  PT End of Session - 05/29/20 1505    Visit Number  4    Number of Visits  12    Date for PT Re-Evaluation  07/31/20    Authorization Type  IE: 05/08/2020    PT Start Time  1500    PT Stop Time  1555    PT Time Calculation (min)  55 min    Activity Tolerance  Patient tolerated treatment well    Behavior During Therapy  [facility] for tasks assessed/performed       Past Medical History:  Diagnosis Date  . Abnormal CT scan    Abdominal  . Allergy   . Anxiety 09/14/2007   Osteoarthritis  . Depression   . Diabetes mellitus without complication (HCC)    Type 1: external insulin pump  . GERD (gastroesophageal reflux disease)   . 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 Hypertension   . 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 Sleep disorder   . 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 CESAREAN SECTION    . CESAREAN SECTION WITH BILATERAL TUBAL LIGATION N/A 09/11/2013   Procedure: Repeat CESAREAN SECTION of baby boy at 1946 APGAR  WITH BILATERAL TUBAL LIGATION;  Surgeon: Freddrick March. Tenny Craw, MD;  Location: [facility];  Service: Obstetrics;  Laterality: N/A;  . CHOLECYSTECTOMY  2010  . DILATION AND CURETTAGE OF UTERUS    . hysterectomy  09/21/2017   [facility]  . Sleep Study  04/14/2015   Home sleep study showing no desaturations.   . TUBAL LIGATION      There were no vitals filed for this visit.  Subjective Assessment - 05/29/20 1501    Subjective  Patient presents to clinic s/p appointment with podiatry. Patient was found to have anterior tib insertional tendinitis. Patient notes after last  session she had some soreness in her RLQ at the hip and the lower abdominal area. She adds that she has been able to do TA exercises but with decreased awareness with increased reps.    Currently in Pain?  Yes    Pain Score  4     Pain Location  Abdomen    Pain Orientation  Right;Lower    Pain Descriptors / Indicators  Sore        TREATMENT  Neuromuscular Re-education: Supine hooklying diaphragmatic breathing with VCs and TCs for downregulation of the nervous system and improved management of IAP Supine hooklying, TrA activation with exhalation. VCs and TCs to decrease compensatory patterns and minimize aggravation of the lumbar paraspinals. KTape application for abdominal cueing/DRAM closure during the above exercises. Patient educated on signs of skin irritation and strategies for easy removal. Seated TrA activation with exhalation. VCs and TCs to decrease compensatory patterns. Performed with GTB for improved postural awareness Review of HEP and creation of tracker for improved HEP completion.  Patient educated throughout session on appropriate technique and form using multi-modal cueing, HEP, and activity modification. Patient articulated understanding and returned demonstration.  Patient Response to interventions: Patient comfortable with HEP and tracker.  ASSESSMENT Patient presents to clinic with excellent motivation to participate in therapy. Patient demonstrates deficits in urinary  urge suppression, IAP management, trunk strength, posture, PFM coordination, PFM strength, and hip mobility. Patient able to achieve TrA activation in standing with awareness after KTape application during today's session and responded positively to active interventions. Patient will benefit from continued skilled therapeutic intervention to address remaining deficits in urinary urge suppression, IAP management, trunk strength, posture, PFM coordination, PFM strength, and hip mobility in order to increase  function and improve overall QOL.     PT Long Term Goals - 05/08/20 1814      PT LONG TERM GOAL #1   Title  Patient will demonstrate independence with HEP in order to maximize therapeutic gains and improve carryover from physical therapy sessions to ADLs in the home and community.    Baseline  IE: not demonstrated    Time  12    Period  Weeks    Status  New    Target Date  07/31/20      PT LONG TERM GOAL #2   Title  Patient will demonstrate independent and coordinated diaphragmatic breathing in supine with a 1:2 breathing pattern for improved down-regulation of the nervous system and improved management of intra-abdominal pressures in order to increase function at home and in the community.    Baseline  IE: not demonstrated    Time  12    Period  Weeks    Status  New    Target Date  07/31/20      PT LONG TERM GOAL #3   Title  Patient will report decreased urgency and UUI when returning to home to < 50% frequency in order to demonstrate improved PFM coordination, strength, and function for improved overall QOL.    Baseline  IE: 75-100%    Time  12    Period  Weeks    Status  New    Target Date  07/31/20      PT LONG TERM GOAL #4   Title  Patient will demonstrate improved function as evidenced by a score of 62 on FOTO measure for full participation in activities at home and in the community.    Baseline  IE: 51    Time  12    Period  Weeks    Status  New    Target Date  07/31/20      PT LONG TERM GOAL #5   Title  Patient will demonstrate improved management of IAP as evidenced by application of breath strategies and Sahrmann Abdominal Rehabilitation Phase 3 completion in order to decrease incidence of UI.    Baseline  IE: not demomstrated    Time  12    Period  Weeks    Status  New    Target Date  07/31/20            Plan - 05/29/20 1506    Clinical Impression Statement  Patient presents to clinic with excellent motivation to participate in therapy. Patient  demonstrates deficits in urinary urge suppression, IAP management, trunk strength, posture, PFM coordination, PFM strength, and hip mobility. Patient able to achieve TrA activation in standing with awareness after KTape application during today's session and responded positively to active interventions. Patient will benefit from continued skilled therapeutic intervention to address remaining deficits in urinary urge suppression, IAP management, trunk strength, posture, PFM coordination, PFM strength, and hip mobility in order to increase function and improve overall QOL.    Personal Factors and Comorbidities  Age;Education;Behavior Pattern;Comorbidity 3+;Past/Current Experience;Fitness;Time since onset of injury/illness/exacerbation    Comorbidities  anxiety,  depression, DM, GERD, HLD, HTN    Examination-Activity Limitations  Sit;Transfers;Sleep;Squat;Bend;Lift;Locomotion Level;Stairs;Stand;Reach Overhead;Continence    Examination-Participation Restrictions  Interpersonal Relationship;Yard Work;Cleaning;Laundry;Community Activity;Shop    Stability/Clinical Decision Making  Evolving/Moderate complexity    Rehab Potential  Fair    PT Frequency  1x / week    PT Duration  12 weeks    PT Treatment/Interventions  Moist Heat;Cryotherapy;ADLs/Self Care Home Management;Electrical Stimulation;Gait training;Stair training;Therapeutic exercise;Balance training;Neuromuscular re-education;Patient/family education;Manual techniques;Passive range of motion;Dry needling;Scar mobilization;Taping;Spinal Manipulations;Joint Manipulations    PT Next Visit Plan  DRAM corrective exercises; kegels    PT Home Exercise Plan  bladder diary    Consulted and Agree with Plan of Care  Patient       Patient will benefit from skilled therapeutic intervention in order to improve the following deficits and impairments:  Abnormal gait, Decreased balance, Decreased endurance, Decreased mobility, Improper body mechanics, Postural  dysfunction, Impaired flexibility, Decreased strength, Decreased coordination, Decreased activity tolerance, Decreased range of motion, Decreased scar mobility  Visit Diagnosis: Muscle weakness (generalized)  Other lack of coordination  Abnormal posture     Problem List Patient Active Problem List   Diagnosis Date Noted  . S/P hysterectomy 03/29/2020  . Residual hemorrhoidal skin tag 02/29/2020  . Diverticulitis 02/28/2020  . Fatigue 02/28/2020  . Pelvic pressure in female 02/28/2020  . Left lower quadrant abdominal pain 02/28/2020  . Urinary frequency 02/28/2020  . Thickened endometrium 05/16/2018  . Menorrhagia 03/18/2018  . Osteoarthritis of hip 03/18/2018  . Postpartum depression 03/18/2018  . Radial styloid tenosynovitis 03/18/2018  . PVC (premature ventricular contraction) 08/24/2017  . History of bladder repair surgery 08/18/2017  . Placenta accreta in third trimester 08/18/2017  . Family history of cardiac arrhythmia 07/12/2017  . Type 1 diabetes mellitus in pregnancy 05/28/2017  . Chronic hypertension affecting pregnancy 05/28/2017  . Hx of preeclampsia, prior pregnancy, currently pregnant, second trimester 05/28/2017  . Advanced maternal age in multigravida, second trimester 05/28/2017  . Hx of postpartum depression, currently pregnant 05/28/2017  . DDD (degenerative disc disease), cervical 09/07/2016  . Abnormal CAT scan 06/17/2016  . Dysfunction of eustachian tube 06/17/2016  . Arthralgia of hip 06/17/2016  . BP (high blood pressure) 06/17/2016  . Disordered sleep 06/17/2016  . Obesity (BMI 30-39.9) 11/09/2014  . Accumulation of fluid in tissues 03/05/2014  . Hypercholesterolemia 08/05/2013  . 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 History of pleurisy 10/12/2007  . Arthritis, degenerative 09/14/2007  . Esophagitis, reflux 07/07/2006  . Clinical depression 11/12/2005  . Type 1 diabetes mellitus (HCC) 11/12/2005  . Combined fat and carbohydrate  induced hyperlipemia 11/12/2005   Sheria Lang PT, DPT [phone] 05/29/2020, 4:02 PM  [facility] [address] [place], [place], [address] Phone: [phone]   Fax:  [phone]  Name: MONTEEN TOOPS MRN: [id number] Date of Birth: Jun 18, 1971

## 2020-06-05 ENCOUNTER — Ambulatory Visit: Payer: Managed Care, Other (non HMO) | Admitting: Physical Therapy

## 2020-06-05 ENCOUNTER — Encounter: Payer: Self-pay | Admitting: Physical Therapy

## 2020-06-05 ENCOUNTER — Other Ambulatory Visit: Payer: Self-pay

## 2020-06-05 DIAGNOSIS — R293 Abnormal posture: Secondary | ICD-10-CM

## 2020-06-05 DIAGNOSIS — R278 Other lack of coordination: Secondary | ICD-10-CM

## 2020-06-05 DIAGNOSIS — M6281 Muscle weakness (generalized): Secondary | ICD-10-CM

## 2020-06-05 NOTE — Therapy (Signed)
[facility] [address] [place], [place], [address] Phone: [phone]   Fax:  [phone]  Physical Therapy Treatment  Patient Details  Name: Andrea Hayden MRN: [id number] Date of Birth: 04-01-1971 Referring Provider (PT): Joycelyn Man   Encounter Date: 06/05/2020  PT End of Session - 06/05/20 1603    Visit Number  5    Number of Visits  12    Date for PT Re-Evaluation  07/31/20    Authorization Type  IE: 05/08/2020    PT Start Time  1600    PT Stop Time  1645    PT Time Calculation (min)  45 min    Activity Tolerance  Patient tolerated treatment well    Behavior During Therapy  [facility] for tasks assessed/performed       Past Medical History:  Diagnosis Date  . Abnormal CT scan    Abdominal  . Allergy   . Anxiety 09/14/2007   Osteoarthritis  . Depression   . Diabetes mellitus without complication (HCC)    Type 1: external insulin pump  . GERD (gastroesophageal reflux disease)   . 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 Hypertension   . 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 Sleep disorder   . 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 CESAREAN SECTION    . CESAREAN SECTION WITH BILATERAL TUBAL LIGATION N/A 09/11/2013   Procedure: Repeat CESAREAN SECTION of baby boy at 1946 APGAR  WITH BILATERAL TUBAL LIGATION;  Surgeon: Freddrick March. Tenny Craw, MD;  Location: [facility];  Service: Obstetrics;  Laterality: N/A;  . CHOLECYSTECTOMY  2010  . DILATION AND CURETTAGE OF UTERUS    . hysterectomy  09/21/2017   [facility]  . Sleep Study  04/14/2015   Home sleep study showing no desaturations.   . TUBAL LIGATION      There were no vitals filed for this visit.  Subjective Assessment - 06/05/20 1601    Subjective  Patient denies any significant changes/concerns since last visit. Patient notes that she did her HEP 3 days 2x/day. Patient also notes that she took  the KTape off on Sunday, but did feel some relief and benefit form wearing it.    Currently in Pain?  No/denies       TREATMENT  Neuromuscular Re-education: Supine hooklying diaphragmatic breathing with VCs and TCs for downregulation of the nervous system and improved management of IAP Supine hooklying, TrA activation with exhalation. VCs and TCs to decrease compensatory patterns and minimize aggravation of the lumbar paraspinals. KTape application for abdominal cueing/DRAM closure during the above exercises. Patient educated on signs of skin irritation and strategies for easy removal. Posture at wall with TrA activation with coordinated breath for improved closure of DRAM Mini wall squat with TrA activation with coordinated breath for improved closure of DRAM  Patient educated throughout session on appropriate technique and form using multi-modal cueing, HEP, and activity modification. Patient articulated understanding and returned demonstration.  Patient Response to interventions: Patient comfortable with updated HEP and tracker.  ASSESSMENT Patient presents to clinic with excellent motivation to participate in therapy. Patient demonstrates deficits in urinary urge suppression, IAP management, trunk strength, posture, PFM coordination, PFM strength, and hip mobility. Patient with much improved pressure management during bed mobility and transfers during today's session and responded positively to active interventions. Patient will benefit from continued skilled therapeutic intervention to address remaining deficits in urinary urge  suppression, IAP management, trunk strength, posture, PFM coordination, PFM strength, and hip mobility in order to increase function and improve overall QOL.     PT Long Term Goals - 05/08/20 1814      PT LONG TERM GOAL #1   Title  Patient will demonstrate independence with HEP in order to maximize therapeutic gains and improve carryover from physical therapy  sessions to ADLs in the home and community.    Baseline  IE: not demonstrated    Time  12    Period  Weeks    Status  New    Target Date  07/31/20      PT LONG TERM GOAL #2   Title  Patient will demonstrate independent and coordinated diaphragmatic breathing in supine with a 1:2 breathing pattern for improved down-regulation of the nervous system and improved management of intra-abdominal pressures in order to increase function at home and in the community.    Baseline  IE: not demonstrated    Time  12    Period  Weeks    Status  New    Target Date  07/31/20      PT LONG TERM GOAL #3   Title  Patient will report decreased urgency and UUI when returning to home to < 50% frequency in order to demonstrate improved PFM coordination, strength, and function for improved overall QOL.    Baseline  IE: 75-100%    Time  12    Period  Weeks    Status  New    Target Date  07/31/20      PT LONG TERM GOAL #4   Title  Patient will demonstrate improved function as evidenced by a score of 62 on FOTO measure for full participation in activities at home and in the community.    Baseline  IE: 51    Time  12    Period  Weeks    Status  New    Target Date  07/31/20      PT LONG TERM GOAL #5   Title  Patient will demonstrate improved management of IAP as evidenced by application of breath strategies and Sahrmann Abdominal Rehabilitation Phase 3 completion in order to decrease incidence of UI.    Baseline  IE: not demomstrated    Time  12    Period  Weeks    Status  New    Target Date  07/31/20            Plan - 06/05/20 1604    Clinical Impression Statement  Patient presents to clinic with excellent motivation to participate in therapy. Patient demonstrates deficits in urinary urge suppression, IAP management, trunk strength, posture, PFM coordination, PFM strength, and hip mobility. Patient with much improved pressure management during bed mobility and transfers during today's session and  responded positively to active interventions. Patient will benefit from continued skilled therapeutic intervention to address remaining deficits in urinary urge suppression, IAP management, trunk strength, posture, PFM coordination, PFM strength, and hip mobility in order to increase function and improve overall QOL.    Personal Factors and Comorbidities  Age;Education;Behavior Pattern;Comorbidity 3+;Past/Current Experience;Fitness;Time since onset of injury/illness/exacerbation    Comorbidities  anxiety, depression, DM, GERD, HLD, HTN    Examination-Activity Limitations  Sit;Transfers;Sleep;Squat;Bend;Lift;Locomotion Level;Stairs;Stand;Reach Overhead;Continence    Examination-Participation Restrictions  Interpersonal Relationship;Yard Work;Cleaning;Laundry;Community Activity;Shop    Stability/Clinical Decision Making  Evolving/Moderate complexity    Rehab Potential  Fair    PT Frequency  1x / week    PT  Duration  12 weeks    PT Treatment/Interventions  Moist Heat;Cryotherapy;ADLs/Self Care Home Management;Electrical Stimulation;Gait training;Stair training;Therapeutic exercise;Balance training;Neuromuscular re-education;Patient/family education;Manual techniques;Passive range of motion;Dry needling;Scar mobilization;Taping;Spinal Manipulations;Joint Manipulations    PT Next Visit Plan  DRAM corrective exercises; kegels    PT Home Exercise Plan  bladder diary    Consulted and Agree with Plan of Care  Patient       Patient will benefit from skilled therapeutic intervention in order to improve the following deficits and impairments:  Abnormal gait, Decreased balance, Decreased endurance, Decreased mobility, Improper body mechanics, Postural dysfunction, Impaired flexibility, Decreased strength, Decreased coordination, Decreased activity tolerance, Decreased range of motion, Decreased scar mobility  Visit Diagnosis: Muscle weakness (generalized)  Other lack of coordination  Abnormal  posture     Problem List Patient Active Problem List   Diagnosis Date Noted  . S/P hysterectomy 03/29/2020  . Residual hemorrhoidal skin tag 02/29/2020  . Diverticulitis 02/28/2020  . Fatigue 02/28/2020  . Pelvic pressure in female 02/28/2020  . Left lower quadrant abdominal pain 02/28/2020  . Urinary frequency 02/28/2020  . Thickened endometrium 05/16/2018  . Menorrhagia 03/18/2018  . Osteoarthritis of hip 03/18/2018  . Postpartum depression 03/18/2018  . Radial styloid tenosynovitis 03/18/2018  . PVC (premature ventricular contraction) 08/24/2017  . History of bladder repair surgery 08/18/2017  . Placenta accreta in third trimester 08/18/2017  . Family history of cardiac arrhythmia 07/12/2017  . Type 1 diabetes mellitus in pregnancy 05/28/2017  . Chronic hypertension affecting pregnancy 05/28/2017  . Hx of preeclampsia, prior pregnancy, currently pregnant, second trimester 05/28/2017  . Advanced maternal age in multigravida, second trimester 05/28/2017  . Hx of postpartum depression, currently pregnant 05/28/2017  . DDD (degenerative disc disease), cervical 09/07/2016  . Abnormal CAT scan 06/17/2016  . Dysfunction of eustachian tube 06/17/2016  . Arthralgia of hip 06/17/2016  . BP (high blood pressure) 06/17/2016  . Disordered sleep 06/17/2016  . Obesity (BMI 30-39.9) 11/09/2014  . Accumulation of fluid in tissues 03/05/2014  . Hypercholesterolemia 08/05/2013  . 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 History of pleurisy 10/12/2007  . Arthritis, degenerative 09/14/2007  . Esophagitis, reflux 07/07/2006  . Clinical depression 11/12/2005  . Type 1 diabetes mellitus ([place]) 11/12/2005  . Combined fat and carbohydrate induced hyperlipemia 11/12/2005    Louie Casa 06/05/2020, 4:54 PM  [facility] [address] [place], [place], [address] Phone: [phone]   Fax:  [phone]  Name: ADDILYNN MOWRER MRN:  [id number] Date of Birth: [birth year]

## 2020-06-06 ENCOUNTER — Telehealth: Payer: Self-pay

## 2020-06-06 DIAGNOSIS — K5792 Diverticulitis of intestine, part unspecified, without perforation or abscess without bleeding: Secondary | ICD-10-CM

## 2020-06-06 MED ORDER — SULFAMETHOXAZOLE-TRIMETHOPRIM 800-160 MG PO TABS: 1.0000 | ORAL_TABLET | Freq: Two times a day (BID) | ORAL | 0 refills | Status: DC

## 2020-06-06 NOTE — Telephone Encounter (Signed)
Patient advised.

## 2020-06-06 NOTE — Telephone Encounter (Signed)
Bactrim sent in. This is what she was treated successfully with last time.

## 2020-06-06 NOTE — Telephone Encounter (Signed)
Copied from CRM [id number]. Topic: General - Inquiry >> Jun 06, 2020 12:49 PM Daphine Deutscher D wrote: Reason for CRM: Pt called saying she is having left abd pain and she thinks it is the diverticulosis acting up.  She said Joycelyn Man told her if it starts acting up she will send something to the pharmacy.  [facility] Ditto

## 2020-06-07 LAB — HM DIABETES EYE EXAM

## 2020-06-10 ENCOUNTER — Encounter: Payer: Self-pay | Admitting: Physician Assistant

## 2020-06-12 ENCOUNTER — Encounter: Payer: Managed Care, Other (non HMO) | Admitting: Physical Therapy

## 2020-06-26 ENCOUNTER — Encounter: Payer: Self-pay | Admitting: Physical Therapy

## 2020-06-26 ENCOUNTER — Ambulatory Visit: Payer: Managed Care, Other (non HMO) | Admitting: Physical Therapy

## 2020-06-26 ENCOUNTER — Other Ambulatory Visit: Payer: Self-pay

## 2020-06-26 DIAGNOSIS — M6281 Muscle weakness (generalized): Secondary | ICD-10-CM

## 2020-06-26 DIAGNOSIS — R278 Other lack of coordination: Secondary | ICD-10-CM

## 2020-06-26 DIAGNOSIS — R293 Abnormal posture: Secondary | ICD-10-CM

## 2020-06-26 NOTE — Therapy (Signed)
[facility] [address] [place], [place], [address] Phone: [phone]   Fax:  [phone]  Physical Therapy Treatment  Patient Details  Name: Andrea Hayden MRN: [id number] Date of Birth: 10-Nov-1971 Referring Provider (PT): Joycelyn Man   Encounter Date: 06/26/2020   PT End of Session - 06/26/20 1511    Visit Number 6    Number of Visits 12    Date for PT Re-Evaluation 07/31/20    Authorization Type IE: 05/08/2020    PT Start Time 1507    PT Stop Time 1600    PT Time Calculation (min) 53 min    Activity Tolerance Patient tolerated treatment well    Behavior During Therapy [facility] for tasks assessed/performed           Past Medical History:  Diagnosis Date  . Abnormal CT scan    Abdominal  . Allergy   . Anxiety 09/14/2007   Osteoarthritis  . Depression   . Diabetes mellitus without complication (HCC)    Type 1: external insulin pump  . GERD (gastroesophageal reflux disease)   . 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 Hypertension   . 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 Sleep disorder   . 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 CESAREAN SECTION    . CESAREAN SECTION WITH BILATERAL TUBAL LIGATION N/A 09/11/2013   Procedure: Repeat CESAREAN SECTION of baby boy at 1946 APGAR  WITH BILATERAL TUBAL LIGATION;  Surgeon: Freddrick March. Tenny Craw, MD;  Location: [facility];  Service: Obstetrics;  Laterality: N/A;  . CHOLECYSTECTOMY  2010  . DILATION AND CURETTAGE OF UTERUS    . hysterectomy  09/21/2017   [facility]  . Sleep Study  04/14/2015   Home sleep study showing no desaturations.   . TUBAL LIGATION      There were no vitals filed for this visit.   Subjective Assessment - 06/26/20 1508    Subjective Patient notes that she had a nice vacation, but has not been doing her exercises. She did do some water aerobics, but did not do any other exercises.  Patient has been having back spasms and pain in the R hip which is relieved by abdominal support, but patient is hesitant to invest in an abdminal brace because of the summer heat.    Currently in Pain? Yes    Pain Score 7     Pain Location Hip    Pain Orientation Right    Pain Type Chronic pain          TREATMENT  Manual Therapy: STM and TPR performed to R hip complex to allow for decreased tension and pain and improved posture and function  Neuromuscular Re-education: Supine hooklying diaphragmatic breathing with VCs and TCs for downregulation of the nervous system and improved management of IAP Supine hooklying, TrA activation with exhalation. VCs and TCs to decrease compensatory patterns and minimize aggravation of the lumbar paraspinals. KTape application for abdominal cueing/DRAM closure during the above exercises. Patient educated on signs of skin irritation and strategies for easy removal. Standing pilates postural control: serve a tray, GTB, with coordinated breath and TrA activation Supine figure 4 stretch for decreased pain and spasm in R hip  Supine knee to chest for decreased pain and spasm in R hip  Patient educated throughout session on appropriate technique and form using multi-modal cueing, HEP, and activity modification. Patient articulated understanding and returned  demonstration.  Patient Response to interventions: 5/10 pain at R hip  ASSESSMENT Patient presents to clinic with excellent motivation to participate in therapy. Patient demonstrates deficits in urinary urge suppression, IAP management, trunk strength, posture, PFM coordination, PFM strength, and hip mobility. Patient having some difficulty coordinating TrA activation and abdominal closure during today's session and responded positively to manual interventions. Patient will benefit from continued skilled therapeutic intervention to address remaining deficits in urinary urge suppression, IAP management,  trunk strength, posture, PFM coordination, PFM strength, and hip mobility in order to increase function and improve overall QOL.    PT Long Term Goals - 05/08/20 1814      PT LONG TERM GOAL #1   Title Patient will demonstrate independence with HEP in order to maximize therapeutic gains and improve carryover from physical therapy sessions to ADLs in the home and community.    Baseline IE: not demonstrated    Time 12    Period Weeks    Status New    Target Date 07/31/20      PT LONG TERM GOAL #2   Title Patient will demonstrate independent and coordinated diaphragmatic breathing in supine with a 1:2 breathing pattern for improved down-regulation of the nervous system and improved management of intra-abdominal pressures in order to increase function at home and in the community.    Baseline IE: not demonstrated    Time 12    Period Weeks    Status New    Target Date 07/31/20      PT LONG TERM GOAL #3   Title Patient will report decreased urgency and UUI when returning to home to < 50% frequency in order to demonstrate improved PFM coordination, strength, and function for improved overall QOL.    Baseline IE: 75-100%    Time 12    Period Weeks    Status New    Target Date 07/31/20      PT LONG TERM GOAL #4   Title Patient will demonstrate improved function as evidenced by a score of 62 on FOTO measure for full participation in activities at home and in the community.    Baseline IE: 51    Time 12    Period Weeks    Status New    Target Date 07/31/20      PT LONG TERM GOAL #5   Title Patient will demonstrate improved management of IAP as evidenced by application of breath strategies and Sahrmann Abdominal Rehabilitation Phase 3 completion in order to decrease incidence of UI.    Baseline IE: not demomstrated    Time 12    Period Weeks    Status New    Target Date 07/31/20                 Plan - 06/26/20 1512    Clinical Impression Statement Patient presents to clinic  with excellent motivation to participate in therapy. Patient demonstrates deficits in urinary urge suppression, IAP management, trunk strength, posture, PFM coordination, PFM strength, and hip mobility. Patient having some difficulty coordinating TrA activation and abdominal closure during today's session and responded positively to manual interventions. Patient will benefit from continued skilled therapeutic intervention to address remaining deficits in urinary urge suppression, IAP management, trunk strength, posture, PFM coordination, PFM strength, and hip mobility in order to increase function and improve overall QOL.    Personal Factors and Comorbidities Age;Education;Behavior Pattern;Comorbidity 3+;Past/Current Experience;Fitness;Time since onset of injury/illness/exacerbation    Comorbidities anxiety, depression, DM, GERD, HLD, HTN  Examination-Activity Limitations Sit;Transfers;Sleep;Squat;Bend;Lift;Locomotion Level;Stairs;Stand;Reach Overhead;Continence    Examination-Participation Restrictions Interpersonal Relationship;Yard Work;Cleaning;Laundry;Community Activity;Shop    Stability/Clinical Decision Making Evolving/Moderate complexity    Rehab Potential Fair    PT Frequency 1x / week    PT Duration 12 weeks    PT Treatment/Interventions Moist Heat;Cryotherapy;ADLs/Self Care Home Management;Electrical Stimulation;Gait training;Stair training;Therapeutic exercise;Balance training;Neuromuscular re-education;Patient/family education;Manual techniques;Passive range of motion;Dry needling;Scar mobilization;Taping;Spinal Manipulations;Joint Manipulations    PT Next Visit Plan DRAM corrective exercises; kegels    PT Home Exercise Plan bladder diary    Consulted and Agree with Plan of Care Patient           Patient will benefit from skilled therapeutic intervention in order to improve the following deficits and impairments:  Abnormal gait, Decreased balance, Decreased endurance, Decreased  mobility, Improper body mechanics, Postural dysfunction, Impaired flexibility, Decreased strength, Decreased coordination, Decreased activity tolerance, Decreased range of motion, Decreased scar mobility  Visit Diagnosis: Muscle weakness (generalized)  Other lack of coordination  Abnormal posture     Problem List Patient Active Problem List   Diagnosis Date Noted  . S/P hysterectomy 03/29/2020  . Residual hemorrhoidal skin tag 02/29/2020  . Diverticulitis 02/28/2020  . Fatigue 02/28/2020  . Pelvic pressure in female 02/28/2020  . Left lower quadrant abdominal pain 02/28/2020  . Urinary frequency 02/28/2020  . Thickened endometrium 05/16/2018  . Menorrhagia 03/18/2018  . Osteoarthritis of hip 03/18/2018  . Postpartum depression 03/18/2018  . Radial styloid tenosynovitis 03/18/2018  . PVC (premature ventricular contraction) 08/24/2017  . History of bladder repair surgery 08/18/2017  . Placenta accreta in third trimester 08/18/2017  . Family history of cardiac arrhythmia 07/12/2017  . Type 1 diabetes mellitus in pregnancy 05/28/2017  . Chronic hypertension affecting pregnancy 05/28/2017  . Hx of preeclampsia, prior pregnancy, currently pregnant, second trimester 05/28/2017  . Advanced maternal age in multigravida, second trimester 05/28/2017  . Hx of postpartum depression, currently pregnant 05/28/2017  . DDD (degenerative disc disease), cervical 09/07/2016  . Abnormal CAT scan 06/17/2016  . Dysfunction of eustachian tube 06/17/2016  . Arthralgia of hip 06/17/2016  . BP (high blood pressure) 06/17/2016  . Disordered sleep 06/17/2016  . Obesity (BMI 30-39.9) 11/09/2014  . Accumulation of fluid in tissues 03/05/2014  . Hypercholesterolemia 08/05/2013  . 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 History of pleurisy 10/12/2007  . Arthritis, degenerative 09/14/2007  . Esophagitis, reflux 07/07/2006  . Clinical depression 11/12/2005  . Type 1 diabetes mellitus (HCC)  11/12/2005  . Combined fat and carbohydrate induced hyperlipemia 11/12/2005    Kathryne Eriksson 06/26/2020, 5:07 PM  [facility] [address] [place], [place], [address] Phone: [phone]   Fax:  [phone]  Name: Andrea Hayden MRN: [id number] Date of Birth: [birth year]

## 2020-07-17 ENCOUNTER — Ambulatory Visit: Payer: Managed Care, Other (non HMO) | Admitting: Physical Therapy

## 2020-07-25 ENCOUNTER — Encounter: Payer: Self-pay | Admitting: Physical Therapy

## 2020-07-25 ENCOUNTER — Ambulatory Visit: Payer: Managed Care, Other (non HMO) | Attending: Physician Assistant | Admitting: Physical Therapy

## 2020-07-25 ENCOUNTER — Other Ambulatory Visit: Payer: Self-pay

## 2020-07-25 DIAGNOSIS — R278 Other lack of coordination: Secondary | ICD-10-CM

## 2020-07-25 DIAGNOSIS — M6281 Muscle weakness (generalized): Secondary | ICD-10-CM | POA: Diagnosis not present

## 2020-07-25 DIAGNOSIS — R293 Abnormal posture: Secondary | ICD-10-CM

## 2020-07-25 NOTE — Therapy (Signed)
[facility] [address] [place], [place], [address] Phone: [phone]   Fax:  [phone]  Physical Therapy Treatment  Patient Details  Name: Andrea Hayden MRN: [id number] Date of Birth: [birth year] Referring Provider (PT): Joycelyn Man   Encounter Date: 07/25/2020   PT End of Session - 07/25/20 1510    Visit Number 7    Number of Visits 12    Date for PT Re-Evaluation 07/31/20    Authorization Type IE: 05/08/2020    PT Start Time 1500    PT Stop Time 1555    PT Time Calculation (min) 55 min    Activity Tolerance Patient tolerated treatment well    Behavior During Therapy [facility] for tasks assessed/performed           Past Medical History:  Diagnosis Date  . Abnormal CT scan    Abdominal  . Allergy   . Anxiety 09/14/2007   Osteoarthritis  . Depression   . Diabetes mellitus without complication (HCC)    Type 1: external insulin pump  . GERD (gastroesophageal reflux disease)   . 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 Hypertension   . 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 Sleep disorder   . 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 CESAREAN SECTION    . CESAREAN SECTION WITH BILATERAL TUBAL LIGATION N/A 09/11/2013   Procedure: Repeat CESAREAN SECTION of baby boy at 1946 APGAR  WITH BILATERAL TUBAL LIGATION;  Surgeon: Freddrick March. Tenny Craw, MD;  Location: [facility];  Service: Obstetrics;  Laterality: N/A;  . CHOLECYSTECTOMY  2010  . DILATION AND CURETTAGE OF UTERUS    . hysterectomy  09/21/2017   [facility]  . Sleep Study  04/14/2015   Home sleep study showing no desaturations.   . TUBAL LIGATION      There were no vitals filed for this visit.   Subjective Assessment - 07/25/20 1505    Subjective Patient notes that she contineus to work on her abdominal coordination exercises but could be more consistent. She is now sensing the muscle connection  more towards the umbilicus. She adds that life has been very hectic and busy which is sometimes in the way of doing her exercises. Patient notes that her R hip pain has gotten much better; the burning has released some, but she continues to benefit from engaging her abdominals when transferring.    Currently in Pain? Yes    Pain Score 4     Pain Location Hip    Pain Orientation Right    Pain Descriptors / Indicators Sore           TREATMENT  Neuromuscular Re-education: Supine hooklying diaphragmatic breathing with VCs and TCs for downregulation of the nervous system and improved management of IAP Supine hooklying, TrA activation with exhalation. VCs and TCs to decrease compensatory patterns and minimize aggravation of the lumbar paraspinals. KTape application for abdominal cueing/DRAM closure during the above exercises. Patient educated on signs of skin irritation and strategies for easy removal. Reassessed goals; see below.  Patient education on abdominal progressions: standing and quadruped positions. Reassessed DRAM: below umbilicus 1 finger; at umbilicus 2.5 finger, superior to umbilicus 2 finger  Patient educated throughout session on appropriate technique and form using multi-modal cueing, HEP, and activity modification. Patient articulated understanding and returned demonstration.  Patient Response to interventions: Denies increased pain, would like to return in a month for  follow-up.  ASSESSMENT Patient presents to clinic with excellent motivation to participate in therapy. Patient demonstrates deficits in urinary urge suppression, IAP management, trunk strength, posture, PFM coordination, PFM strength, and hip mobility. Patient has made great progress toward her goals and has significantly reduced her DRAM (below the umbilicus 1 finger width from 3 finger width, umbilicus 2.5 finger width from > 4 finger width, superior to the umbilicus 2 finger width from 3 finger width) and has  responded positively to manual and active interventions. Patient's condition has the potential to improve in response to therapy. Maximum improvement is yet to be obtained. The anticipated improvement is attainable and reasonable in a generally predictable time. Patient will benefit from continued skilled therapeutic intervention to address remaining deficits in urinary urge suppression, IAP management, trunk strength, posture, PFM coordination, PFM strength, and hip mobility in order to increase function and improve overall QOL.     PT Long Term Goals - 07/25/20 1511      PT LONG TERM GOAL #1   Title Patient will demonstrate independence with HEP in order to maximize therapeutic gains and improve carryover from physical therapy sessions to ADLs in the home and community.    Baseline IE: not demonstrated; 7/29: 70%    Time 12    Period Weeks    Status On-going    Target Date 07/31/20      PT LONG TERM GOAL #2   Title Patient will demonstrate independent and coordinated diaphragmatic breathing in supine with a 1:2 breathing pattern for improved down-regulation of the nervous system and improved management of intra-abdominal pressures in order to increase function at home and in the community.    Baseline IE: not demonstrated; 7/29: IND    Time 12    Period Weeks    Status Achieved      PT LONG TERM GOAL #3   Title Patient will report decreased urgency and UUI when returning to home to < 50% frequency in order to demonstrate improved PFM coordination, strength, and function for improved overall QOL.    Baseline IE: 75-100%; 7/29: 25%    Time 12    Period Weeks    Status Achieved      PT LONG TERM GOAL #4   Title Patient will demonstrate improved function as evidenced by a score of 62 on FOTO measure for full participation in activities at home and in the community.    Baseline IE: 51; 7/29: 63    Time 12    Period Weeks    Status Achieved      PT LONG TERM GOAL #5   Title Patient  will demonstrate improved management of IAP as evidenced by application of breath strategies and Sahrmann Abdominal Rehabilitation Phase 3 completion in order to decrease incidence of UI.    Baseline IE: not demomstrated; 7/29: IND with breath strategies; Sahrmann Abdominal Rehabilitation phase 2    Time 12    Period Weeks    Status On-going                 Plan - 07/25/20 1741    Clinical Impression Statement Patient presents to clinic with excellent motivation to participate in therapy. Patient demonstrates deficits in urinary urge suppression, IAP management, trunk strength, posture, PFM coordination, PFM strength, and hip mobility. Patient has made great progress toward her goals and has significantly reduced her DRAM (below the umbilicus 1 finger width from 3 finger width, umbilicus 2.5 finger width from > 4 finger width,  superior to the umbilicus 2 finger width from 3 finger width) and has responded positively to manual and active interventions. Patient's condition has the potential to improve in response to therapy. Maximum improvement is yet to be obtained. The anticipated improvement is attainable and reasonable in a generally predictable time. Patient will benefit from continued skilled therapeutic intervention to address remaining deficits in urinary urge suppression, IAP management, trunk strength, posture, PFM coordination, PFM strength, and hip mobility in order to increase function and improve overall QOL.    Personal Factors and Comorbidities Age;Education;Behavior Pattern;Comorbidity 3+;Past/Current Experience;Fitness;Time since onset of injury/illness/exacerbation    Comorbidities anxiety, depression, DM, GERD, HLD, HTN    Examination-Activity Limitations Sit;Transfers;Sleep;Squat;Bend;Lift;Locomotion Level;Stairs;Stand;Reach Overhead;Continence    Examination-Participation Restrictions Interpersonal Relationship;Yard Work;Cleaning;Laundry;Community Activity;Shop     Stability/Clinical Decision Making Evolving/Moderate complexity    Rehab Potential Fair    PT Frequency 1x / week    PT Duration 12 weeks    PT Treatment/Interventions Moist Heat;Cryotherapy;ADLs/Self Care Home Management;Electrical Stimulation;Gait training;Stair training;Therapeutic exercise;Balance training;Neuromuscular re-education;Patient/family education;Manual techniques;Passive range of motion;Dry needling;Scar mobilization;Taping;Spinal Manipulations;Joint Manipulations    PT Next Visit Plan DRAM corrective exercises; kegels    PT Home Exercise Plan standing abdominal rehab    Consulted and Agree with Plan of Care Patient           Patient will benefit from skilled therapeutic intervention in order to improve the following deficits and impairments:  Abnormal gait, Decreased balance, Decreased endurance, Decreased mobility, Improper body mechanics, Postural dysfunction, Impaired flexibility, Decreased strength, Decreased coordination, Decreased activity tolerance, Decreased range of motion, Decreased scar mobility  Visit Diagnosis: Muscle weakness (generalized)  Other lack of coordination  Abnormal posture     Problem List Patient Active Problem List   Diagnosis Date Noted  . S/P hysterectomy 03/29/2020  . Residual hemorrhoidal skin tag 02/29/2020  . Diverticulitis 02/28/2020  . Fatigue 02/28/2020  . Pelvic pressure in female 02/28/2020  . Left lower quadrant abdominal pain 02/28/2020  . Urinary frequency 02/28/2020  . Thickened endometrium 05/16/2018  . Menorrhagia 03/18/2018  . Osteoarthritis of hip 03/18/2018  . Postpartum depression 03/18/2018  . Radial styloid tenosynovitis 03/18/2018  . PVC (premature ventricular contraction) 08/24/2017  . History of bladder repair surgery 08/18/2017  . Placenta accreta in third trimester 08/18/2017  . Family history of cardiac arrhythmia 07/12/2017  . Type 1 diabetes mellitus in pregnancy 05/28/2017  . Chronic hypertension  affecting pregnancy 05/28/2017  . Hx of preeclampsia, prior pregnancy, currently pregnant, second trimester 05/28/2017  . Advanced maternal age in multigravida, second trimester 05/28/2017  . Hx of postpartum depression, currently pregnant 05/28/2017  . DDD (degenerative disc disease), cervical 09/07/2016  . Abnormal CAT scan 06/17/2016  . Dysfunction of eustachian tube 06/17/2016  . Arthralgia of hip 06/17/2016  . BP (high blood pressure) 06/17/2016  . Disordered sleep 06/17/2016  . Obesity (BMI 30-39.9) 11/09/2014  . Accumulation of fluid in tissues 03/05/2014  . Hypercholesterolemia 08/05/2013  . 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 History of pleurisy 10/12/2007  . Arthritis, degenerative 09/14/2007  . Esophagitis, reflux 07/07/2006  . Clinical depression 11/12/2005  . Type 1 diabetes mellitus (HCC) 11/12/2005  . Combined fat and carbohydrate induced hyperlipemia 11/12/2005   Sheria Lang PT, DPT [phone] 07/25/2020, 5:49 PM  [facility] [address]. [place], [place], [address] Phone: [phone]   Fax:  [phone]  Name: Andrea Hayden MRN: [id number] Date of Birth: [birth year]

## 2020-08-22 ENCOUNTER — Ambulatory Visit: Payer: Managed Care, Other (non HMO) | Admitting: Physical Therapy

## 2020-08-26 ENCOUNTER — Ambulatory Visit: Payer: Self-pay | Admitting: *Deleted

## 2020-08-26 ENCOUNTER — Encounter: Payer: Self-pay | Admitting: Physician Assistant

## 2020-08-26 DIAGNOSIS — K5792 Diverticulitis of intestine, part unspecified, without perforation or abscess without bleeding: Secondary | ICD-10-CM

## 2020-08-26 MED ORDER — SULFAMETHOXAZOLE-TRIMETHOPRIM 800-160 MG PO TABS: 1.0000 | ORAL_TABLET | Freq: Two times a day (BID) | ORAL | 0 refills | Status: DC

## 2020-08-26 NOTE — Telephone Encounter (Signed)
Possible flare of diverticulitis. Upper abdomen bloating, discomfort belching and passing gas for 3 days. Symptoms began after eating pizza Friday night. No fever. Diarrhea today. Care Advice as recorded. Call back if needed. If worsens seek treatment at the UC/ED.  Reason for Disposition . [1] MILD pain (e.g., does not interfere with normal activities) AND [2] comes and goes (cramps) AND [3] present > 72 hours  (Exception: this same abdominal pain is a chronic symptom recurrent or ongoing AND present > 4 weeks)  Answer Assessment - Initial Assessment Questions 1. LOCATION: "Where does it hurt?"     Under the breast 2. RADIATION: "Does the pain shoot anywhere else?" (e.g., chest, back)      3. ONSET: "When did the pain begin?" (e.g., minutes, hours or days ago)      Friday evening 4. SUDDEN: "Gradual or sudden onset?"     gradual 5. PATTERN "Does the pain come and go, or is it constant?"comes and goes, gets a little when she moves around    - If constant: "Is it getting better, staying the same, or worsening?"      (Note: Constant means the pain never goes away completely; most serious pain is constant and it progresses)     - If intermittent: "How long does it last?" "Do you have pain now?"     (Note: Intermittent means the pain goes away completely between bouts)     intermittent 6. SEVERITY: "How bad is the pain?"  (e.g., Scale 1-10; mild, moderate, or severe)    - MILD (1-3): doesn't interfere with normal activities, abdomen soft and not tender to touch     - MODERATE (4-7): interferes with normal activities or awakens from sleep, tender to touch     - SEVERE (8-10): excruciating pain, doubled over, unable to do any normal activities       mild 7. RECURRENT SYMPTOM: "Have you ever had this type of stomach pain before?" If Yes, ask: "When was the last time?" and "What happened that time?"      Yes, diverticulitis 8. AGGRAVATING FACTORS: "Does anything seem to cause this pain?" (e.g.,  foods, stress, alcohol) triggered after eating pizza     9. CARDIAC SYMPTOMS: "Do you have any of the following symptoms: chest pain, difficulty breathing, sweating, nausea?"     none 10. OTHER SYMPTOMS: "Do you have any other symptoms?" (e.g., fever, vomiting, diarrhea)       diarrhea 11. PREGNANCY: "Is there any chance you are pregnant?" "When was your last menstrual period?"      na  Protocols used: ABDOMINAL PAIN - UPPER-A-AH

## 2020-08-26 NOTE — Addendum Note (Signed)
Addended by: Margaretann Loveless on: 08/26/2020 03:59 PM   Modules accepted: Orders

## 2020-08-28 NOTE — Progress Notes (Signed)
Established patient visit   Patient: Andrea Hayden   DOB: 1971-02-05   49 y.o. Female  MRN: [id number] Visit Date: 08/29/2020  Today's healthcare provider: Mar Daring, PA-C   Chief Complaint  Patient presents with  . Bloated   Subjective    HPI  Patient here with c/o indigestion that started on Friday evening. Reports that on Saturday the pain was at the epigastric region, heartburn, pressure, bloating, belching and gas. She did have some discomfort in the lower intestinal area. She had some diarrhea on Monday and Tuesday morning. She has been drinking more liquids. Bactrim was sent in for her and she reports that since yesterday she has been feeling better. She still has the gurgling in her stomach but better. Does have h/o diverticulitis and states pain has felt similar.  Patient Active Problem List   Diagnosis Date Noted  . S/P hysterectomy 03/29/2020  . Residual hemorrhoidal skin tag 02/29/2020  . Diverticulitis 02/28/2020  . Fatigue 02/28/2020  . Pelvic pressure in female 02/28/2020  . Left lower quadrant abdominal pain 02/28/2020  . Urinary frequency 02/28/2020  . Thickened endometrium 05/16/2018  . Menorrhagia 03/18/2018  . Osteoarthritis of hip 03/18/2018  . Postpartum depression 03/18/2018  . Radial styloid tenosynovitis 03/18/2018  . PVC (premature ventricular contraction) 08/24/2017  . History of bladder repair surgery 08/18/2017  . Placenta accreta in third trimester 08/18/2017  . Family history of cardiac arrhythmia 07/12/2017  . Type 1 diabetes mellitus in pregnancy 05/28/2017  . Chronic hypertension affecting pregnancy 05/28/2017  . Hx of preeclampsia, prior pregnancy, currently pregnant, second trimester 05/28/2017  . Advanced maternal age in multigravida, second trimester 05/28/2017  . Hx of postpartum depression, currently pregnant 05/28/2017  . DDD (degenerative disc disease), cervical 09/07/2016  . Abnormal CAT scan 06/17/2016  .  Dysfunction of eustachian tube 06/17/2016  . Arthralgia of hip 06/17/2016  . BP (high blood pressure) 06/17/2016  . Disordered sleep 06/17/2016  . Obesity (BMI 30-39.9) 11/09/2014  . Accumulation of fluid in tissues 03/05/2014  . Hypercholesterolemia 08/05/2013  . 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 History of pleurisy 10/12/2007  . Arthritis, degenerative 09/14/2007  . Esophagitis, reflux 07/07/2006  . Clinical depression 11/12/2005  . Type 1 diabetes mellitus ([place]) 11/12/2005  . Combined fat and carbohydrate induced hyperlipemia 11/12/2005   Past Medical History:  Diagnosis Date  . Abnormal CT scan    Abdominal  . Allergy   . Anxiety 09/14/2007   Osteoarthritis  . Depression   . Diabetes mellitus without complication (HCC)    Type 1: external insulin pump  . GERD (gastroesophageal reflux disease)   . 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 Hypertension   . 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 Sleep disorder   . 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 BAYER CONTOUR NEXT TEST test strip Reported on 06/18/2016  . glucose blood (CONTOUR NEXT TEST) test strip USE UP TO SIX TIMES DAILY. TESTING DUE TO PREGNANCY AND HISTORY OF HYPOGLYCEMIA  . insulin aspart (NOVOLOG) 100 UNIT/ML injection Novolog U-100 Insulin aspart 100 unit/mL subcutaneous solution  . Insulin Human (INSULIN PUMP) 100 unit/ml SOLN Inject 1 each into the skin See admin instructions. Insulin pump, Novolog insulin  . meloxicam (MOBIC) 15 MG tablet Take 1 tablet (15 mg total) by mouth daily.  Marland Kitchen NOVOLOG 100 UNIT/ML injection   . sulfamethoxazole-trimethoprim (BACTRIM DS) 800-160 MG  tablet Take 1 tablet by mouth 2 (two) times daily.  . metoprolol tartrate (LOPRESSOR) 25 MG tablet Take 12.5 mg by mouth 2 (two) times daily.  . [DISCONTINUED] escitalopram (LEXAPRO) 10 MG tablet Take 10 mg by mouth.  . [DISCONTINUED]  glucagon 1 MG injection Glucagon Emergency Kit 1 mg solution for injection  . [DISCONTINUED] hydrochlorothiazide (HYDRODIURIL) 25 MG tablet Take 1 tablet by mouth daily.  . [DISCONTINUED] metoprolol tartrate (LOPRESSOR) 25 MG tablet metoprolol tartrate 25 mg tablet  . [DISCONTINUED] PRESCRIPTION MEDICATION Cholestar-RF qd   No facility-administered medications prior to visit.    Review of Systems  Constitutional: Negative.   Respiratory: Negative.   Cardiovascular: Negative.   Gastrointestinal: Positive for abdominal distention, abdominal pain and diarrhea.  Neurological: Negative.     Last CBC Lab Results  Component Value Date   WBC 4.8 03/08/2020   HGB 14.0 03/08/2020   HCT 41.6 03/08/2020   MCV 90 03/08/2020   MCH 30.3 03/08/2020   RDW 12.2 03/08/2020   PLT 309 31/51/7616   Last metabolic panel Lab Results  Component Value Date   GLUCOSE 62 (L) 02/28/2020   NA 140 02/28/2020   K 4.2 02/28/2020   CL 101 02/28/2020   CO2 26 02/28/2020   BUN 10 02/28/2020   CREATININE 0.79 02/28/2020   GFRNONAA 89 02/28/2020   GFRAA 103 02/28/2020   CALCIUM 9.2 02/28/2020   PROT 6.9 03/08/2020   ALBUMIN 4.2 03/08/2020   LABGLOB 2.7 02/28/2020   AGRATIO 1.6 02/28/2020   BILITOT 0.2 03/08/2020   ALKPHOS 148 (H) 03/08/2020   AST 13 03/08/2020   ALT 12 03/08/2020   ANIONGAP 7 06/15/2012      Objective    BP (!) 144/84 (BP Location: Left Arm, Patient Position: Sitting, Cuff Size: Large)   Pulse (!) 57   Temp 98.4 F (36.9 C) (Oral)   Resp 16   Wt 227 lb 12.8 oz (103.3 kg)   LMP 01/20/2017   BMI 39.10 kg/m  BP Readings from Last 3 Encounters:  08/29/20 (!) 144/84  03/29/20 (!) 160/96  03/08/20 (!) 142/85   Wt Readings from Last 3 Encounters:  08/29/20 227 lb 12.8 oz (103.3 kg)  03/29/20 232 lb 6.4 oz (105.4 kg)  03/08/20 228 lb 3.2 oz (103.5 kg)      Physical Exam Vitals reviewed.  Constitutional:      General: She is not in acute distress.    Appearance: Normal  appearance. She is well-developed. She is obese. She is not ill-appearing or diaphoretic.  Cardiovascular:     Rate and Rhythm: Normal rate and regular rhythm.     Heart sounds: Normal heart sounds. No murmur heard.  No friction rub. No gallop.   Pulmonary:     Effort: Pulmonary effort is normal. No respiratory distress.     Breath sounds: Normal breath sounds. No wheezing or rales.  Abdominal:     General: Bowel sounds are normal. There is no distension.     Palpations: Abdomen is soft. There is no mass.     Tenderness: There is no abdominal tenderness. There is no guarding or rebound.  Skin:    General: Skin is warm and dry.  Neurological:     Mental Status: She is alert and oriented to person, place, and time.       No results found for any visits on 08/29/20.  Assessment & Plan     1. Diverticulitis Suspect diverticulitis recurrence and is already improving with Bactrim.  Continue antibiotic until completed. Push fluids. Call if worsening.    Return if symptoms worsen or fail to improve.      Reynolds Bowl, PA-C, have reviewed all documentation for this visit. The documentation on 09/04/20 for the exam, diagnosis, procedures, and orders are all accurate and complete.   Rubye Beach  [facility] [phone] (phone) [phone] (fax)  [place]

## 2020-08-29 ENCOUNTER — Ambulatory Visit (INDEPENDENT_AMBULATORY_CARE_PROVIDER_SITE_OTHER): Payer: Managed Care, Other (non HMO) | Admitting: Physician Assistant

## 2020-08-29 ENCOUNTER — Other Ambulatory Visit: Payer: Self-pay

## 2020-08-29 ENCOUNTER — Encounter: Payer: Self-pay | Admitting: Physician Assistant

## 2020-08-29 VITALS — BP 144/84 | HR 57 | Temp 98.4°F | Resp 16 | Wt 227.8 lb

## 2020-08-29 DIAGNOSIS — K5792 Diverticulitis of intestine, part unspecified, without perforation or abscess without bleeding: Secondary | ICD-10-CM

## 2020-08-29 NOTE — Patient Instructions (Signed)
Diverticulitis  Diverticulitis is infection or inflammation of small pouches (diverticula) in the colon that form due to a condition called diverticulosis. Diverticula can trap stool (feces) and bacteria, causing infection and inflammation. Diverticulitis may cause severe stomach pain and diarrhea. It may lead to tissue damage in the colon that causes bleeding. The diverticula may also burst (rupture) and cause infected stool to enter other areas of the abdomen. Complications of diverticulitis can include:  Bleeding.  Severe infection.  Severe pain.  Rupture (perforation) of the colon.  Blockage (obstruction) of the colon. What are the causes? This condition is caused by stool becoming trapped in the diverticula, which allows bacteria to grow in the diverticula. This leads to inflammation and infection. What increases the risk? You are more likely to develop this condition if:  You have diverticulosis. The risk for diverticulosis increases if: ? You are overweight or obese. ? You use tobacco products. ? You do not get enough exercise.  You eat a diet that does not include enough fiber. High-fiber foods include fruits, vegetables, beans, nuts, and whole grains. What are the signs or symptoms? Symptoms of this condition may include:  Pain and tenderness in the abdomen. The pain is normally located on the left side of the abdomen, but it may occur in other areas.  Fever and chills.  Bloating.  Cramping.  Nausea.  Vomiting.  Changes in bowel routines.  Blood in your stool. How is this diagnosed? This condition is diagnosed based on:  Your medical history.  A physical exam.  Tests to make sure there is nothing else causing your condition. These tests may include: ? Blood tests. ? Urine tests. ? Imaging tests of the abdomen, including X-rays, ultrasounds, MRIs, or CT scans. How is this treated? Most cases of this condition are mild and can be treated at home.  Treatment may include:  Taking over-the-counter pain medicines.  Following a clear liquid diet.  Taking antibiotic medicines by mouth.  Rest. More severe cases may need to be treated at a hospital. Treatment may include:  Not eating or drinking.  Taking prescription pain medicine.  Receiving antibiotic medicines through an IV tube.  Receiving fluids and nutrition through an IV tube.  Surgery. When your condition is under control, your health care provider may recommend that you have a colonoscopy. This is an exam to look at the entire large intestine. During the exam, a lubricated, bendable tube is inserted into the anus and then passed into the rectum, colon, and other parts of the large intestine. A colonoscopy can show how severe your diverticula are and whether something else may be causing your symptoms. Follow these instructions at home: Medicines  Take over-the-counter and prescription medicines only as told by your health care provider. These include fiber supplements, probiotics, and stool softeners.  If you were prescribed an antibiotic medicine, take it as told by your health care provider. Do not stop taking the antibiotic even if you start to feel better.  Do not drive or use heavy machinery while taking prescription pain medicine. General instructions   Follow a full liquid diet or another diet as directed by your health care provider. After your symptoms improve, your health care provider may tell you to change your diet. He or she may recommend that you eat a diet that contains at least 25 g (25 grams) of fiber daily. Fiber makes it easier to pass stool. Healthy sources of fiber include: ? Berries. One cup contains 4-8 grams of   fiber. ? Beans or lentils. One half cup contains 5-8 grams of fiber. ? Green vegetables. One cup contains 4 grams of fiber.  Exercise for at least 30 minutes, 3 times each week. You should exercise hard enough to raise your heart rate and  break a sweat.  Keep all follow-up visits as told by your health care provider. This is important. You may need a colonoscopy. Contact a health care provider if:  Your pain does not improve.  You have a hard time drinking or eating food.  Your bowel movements do not return to normal. Get help right away if:  Your pain gets worse.  Your symptoms do not get better with treatment.  Your symptoms suddenly get worse.  You have a fever.  You vomit more than one time.  You have stools that are bloody, black, or tarry. Summary  Diverticulitis is infection or inflammation of small pouches (diverticula) in the colon that form due to a condition called diverticulosis. Diverticula can trap stool (feces) and bacteria, causing infection and inflammation.  You are at higher risk for this condition if you have diverticulosis and you eat a diet that does not include enough fiber.  Most cases of this condition are mild and can be treated at home. More severe cases may need to be treated at a hospital.  When your condition is under control, your health care provider may recommend that you have an exam called a colonoscopy. This exam can show how severe your diverticula are and whether something else may be causing your symptoms. This information is not intended to replace advice given to you by your health care provider. Make sure you discuss any questions you have with your health care provider. Document Revised: 11/26/2017 Document Reviewed: 01/16/2017 Elsevier Patient Education  2020 [employer]  

## 2020-10-02 ENCOUNTER — Ambulatory Visit (INDEPENDENT_AMBULATORY_CARE_PROVIDER_SITE_OTHER): Payer: Managed Care, Other (non HMO) | Admitting: Podiatry

## 2020-10-02 ENCOUNTER — Ambulatory Visit: Payer: Managed Care, Other (non HMO) | Admitting: Orthotics

## 2020-10-02 ENCOUNTER — Encounter: Payer: Self-pay | Admitting: Podiatry

## 2020-10-02 ENCOUNTER — Other Ambulatory Visit: Payer: Self-pay

## 2020-10-02 DIAGNOSIS — M76812 Anterior tibial syndrome, left leg: Secondary | ICD-10-CM

## 2020-10-02 DIAGNOSIS — M722 Plantar fascial fibromatosis: Secondary | ICD-10-CM | POA: Diagnosis not present

## 2020-10-02 DIAGNOSIS — Q666 Other congenital valgus deformities of feet: Secondary | ICD-10-CM

## 2020-10-02 MED ORDER — METHYLPREDNISOLONE 4 MG PO TBPK: ORAL_TABLET | ORAL | 0 refills | Status: DC

## 2020-10-02 NOTE — Progress Notes (Signed)
She presents today for follow-up with Raiford Noble about orthotics.  States that the orthotics feel like they are too high and they are rolling her foot to the outside.  At this point she will have them remade..  She is also complaining of some pain once again to her left heel.  She states that at this time she would like to take the injection and the methylprednisolone I did discuss this with her in great detail because of her diabetes I am she does utilize a pump and I expressed to her that she probably needed to talk to her nurse on how to adjust the pump to help cover on the higher blood sugars that will occur.  Objective: Vital signs are stable alert oriented x3 there is pain on palpation medial calcaneal tubercle of the left heel.  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  Plan: At this point injected the left heel 10 mg Kenalog 5 mg Marcaine start her on a Medrol Dosepak just 4 mg over 6 days now follow-up with her in about 6 weeks hopefully her orthotics will be in at that time.

## 2020-10-02 NOTE — Progress Notes (Signed)
Need to lower arches on f/o Andrea Hayden); also need to add valgus RF post.

## 2020-11-13 ENCOUNTER — Other Ambulatory Visit: Payer: Self-pay

## 2020-11-13 ENCOUNTER — Ambulatory Visit (INDEPENDENT_AMBULATORY_CARE_PROVIDER_SITE_OTHER): Payer: Managed Care, Other (non HMO) | Admitting: Podiatry

## 2020-11-13 ENCOUNTER — Encounter: Payer: Self-pay | Admitting: Podiatry

## 2020-11-13 DIAGNOSIS — M76812 Anterior tibial syndrome, left leg: Secondary | ICD-10-CM

## 2020-11-13 DIAGNOSIS — M722 Plantar fascial fibromatosis: Secondary | ICD-10-CM | POA: Diagnosis not present

## 2020-11-13 NOTE — Progress Notes (Signed)
She presents today for a follow-up of her plantar fasciitis of her left foot.  She states that it is 90 to 95% improved.  She would like to have her orthotics back but they are not available as of yet.  She is quite upset about this.  Objective: Vital signs are stable she is alert and oriented x3 there is no erythema edema cellulitis drainage or odor she has no pain on palpation medial calcaneal tubercle of the left heel.  Assessment: Resolving plantar fasciitis.  Plan: Today because we cannot get her orthotics back in a timely fashion I recommended to Raiford Noble that he make her a new set of orthotics and have them expedited immediately.  I will follow-up with her in a 1 month or sooner if needed.

## 2020-11-17 ENCOUNTER — Other Ambulatory Visit: Payer: Self-pay | Admitting: Podiatry

## 2021-03-09 ENCOUNTER — Encounter: Payer: Self-pay | Admitting: Physician Assistant

## 2021-03-09 DIAGNOSIS — E109 Type 1 diabetes mellitus without complications: Secondary | ICD-10-CM

## 2021-03-12 NOTE — Addendum Note (Signed)
Addended by: Margaretann Loveless on: 03/12/2021 12:46 PM   Modules accepted: Orders

## 2021-08-07 LAB — HM DIABETES EYE EXAM

## 2021-08-19 LAB — HM DIABETES EYE EXAM

## 2023-09-15 LAB — COLOGUARD: COLOGUARD: NEGATIVE
